# Patient Record
Sex: Female | Born: 1990
Health system: Southern US, Community
[De-identification: ages and names within clinical notes are randomized; demographics above are authoritative.]

## PROBLEM LIST (undated history)

## (undated) DIAGNOSIS — C9590 Leukemia, unspecified not having achieved remission: Secondary | ICD-10-CM

## (undated) DIAGNOSIS — Z331 Pregnant state, incidental: Secondary | ICD-10-CM

## (undated) DIAGNOSIS — B009 Herpesviral infection, unspecified: Secondary | ICD-10-CM

## (undated) HISTORY — PX: OTHER SURGICAL HISTORY: SHX169

## (undated) HISTORY — PX: NO PAST SURGERIES: SHX2092

---

## 2009-11-03 ENCOUNTER — Emergency Department (HOSPITAL_COMMUNITY): Admission: EM | Admit: 2009-11-03 | Discharge: 2009-11-03 | Payer: Self-pay | Admitting: Emergency Medicine

## 2010-06-19 LAB — CBC
Hemoglobin: 12.5 g/dL (ref 12.0–15.0)
MCH: 29.7 pg (ref 26.0–34.0)
RBC: 4.23 MIL/uL (ref 3.87–5.11)

## 2010-06-19 LAB — COMPREHENSIVE METABOLIC PANEL
ALT: 10 U/L (ref 0–35)
AST: 13 U/L (ref 0–37)
CO2: 27 mEq/L (ref 19–32)
Chloride: 105 mEq/L (ref 96–112)
Creatinine, Ser: 0.72 mg/dL (ref 0.4–1.2)
GFR calc Af Amer: >60 mL/min (ref >60)
GFR calc non Af Amer: >60 mL/min (ref >60)
Sodium: 137 mEq/L (ref 135–145)
Total Bilirubin: 0.4 mg/dL (ref 0.3–1.2)

## 2010-06-19 LAB — DIFFERENTIAL
Basophils Absolute: 0 10*3/uL (ref 0.0–0.1)
Basophils Relative: 0 % (ref 0–1)
Eosinophils Absolute: 0.1 10*3/uL (ref 0.0–0.7)
Eosinophils Relative: 1 % (ref 0–5)

## 2010-06-19 LAB — URINE CULTURE

## 2010-06-19 LAB — URINALYSIS, ROUTINE W REFLEX MICROSCOPIC
Glucose, UA: NEGATIVE mg/dL
Ketones, ur: NEGATIVE mg/dL
Urobilinogen, UA: 0.2 mg/dL (ref 0.0–1.0)

## 2010-06-19 LAB — URINE MICROSCOPIC-ADD ON

## 2011-02-02 LAB — OB RESULTS CONSOLE HIV ANTIBODY (ROUTINE TESTING): HIV: NONREACTIVE

## 2011-02-02 LAB — OB RESULTS CONSOLE RPR: RPR: NONREACTIVE

## 2011-02-02 LAB — OB RESULTS CONSOLE GC/CHLAMYDIA: Chlamydia: NEGATIVE

## 2011-02-02 LAB — OB RESULTS CONSOLE ABO/RH: RH Type: POSITIVE

## 2011-02-02 LAB — OB RESULTS CONSOLE RUBELLA ANTIBODY, IGM: Rubella: IMMUNE

## 2011-02-02 LAB — OB RESULTS CONSOLE HEPATITIS B SURFACE ANTIGEN: Hepatitis B Surface Ag: NEGATIVE

## 2011-03-08 ENCOUNTER — Encounter: Payer: Self-pay | Admitting: *Deleted

## 2011-03-08 ENCOUNTER — Emergency Department (HOSPITAL_COMMUNITY)
Admission: EM | Admit: 2011-03-08 | Discharge: 2011-03-09 | Discharge status: Against Medical Advice | Payer: Medicaid Other | Attending: Emergency Medicine | Admitting: Emergency Medicine

## 2011-03-08 DIAGNOSIS — O210 Mild hyperemesis gravidarum: Secondary | ICD-10-CM | POA: Insufficient documentation

## 2011-03-08 DIAGNOSIS — R197 Diarrhea, unspecified: Secondary | ICD-10-CM | POA: Insufficient documentation

## 2011-03-08 HISTORY — DX: Leukemia, unspecified not having achieved remission: C95.90

## 2011-03-08 HISTORY — DX: Pregnant state, incidental: Z33.1

## 2011-03-08 NOTE — ED Notes (Signed)
Vomiting all day,  diarrhea

## 2011-04-06 NOTE — L&D Delivery Note (Signed)
I was present for this delivery.  Agree with above note.  Levie Heritage, DO 10/20/2011 12:04 PM

## 2011-04-06 NOTE — L&D Delivery Note (Signed)
Delivery Note At 10:42 AM a viable and healthy female was delivered via  (Presentation: LOA).  APGAR: , ; weight .   Placenta status: delivered intact, spontaneous.  Cord: 3 vessels.  No complications.  Anesthesia: Epidural  Lacerations: n/a  Est. Blood Loss (mL): 500  Mom to postpartum.  Baby to nursery-stable.  Bedelia Person, PA-S 08/26/2011, 10:57 AM

## 2011-08-25 ENCOUNTER — Encounter (HOSPITAL_COMMUNITY): Payer: Self-pay | Admitting: *Deleted

## 2011-08-25 ENCOUNTER — Inpatient Hospital Stay (HOSPITAL_COMMUNITY)
Admission: AD | Admit: 2011-08-25 | Discharge: 2011-08-26 | Disposition: A | Discharge status: Home / Self Care | Payer: Medicaid Other | Source: Ambulatory Visit | Attending: Obstetrics & Gynecology | Admitting: Obstetrics & Gynecology

## 2011-08-25 DIAGNOSIS — O99891 Other specified diseases and conditions complicating pregnancy: Secondary | ICD-10-CM | POA: Insufficient documentation

## 2011-08-25 DIAGNOSIS — R109 Unspecified abdominal pain: Secondary | ICD-10-CM | POA: Insufficient documentation

## 2011-08-25 HISTORY — DX: Herpesviral infection, unspecified: B00.9

## 2011-08-25 NOTE — MAU Note (Signed)
Pt G1 at 38wks, cramping, passed mucous plug 2 hrs ago.  Seen in the office today SVE 2cm.

## 2011-08-26 ENCOUNTER — Encounter (HOSPITAL_COMMUNITY): Payer: Self-pay | Admitting: Anesthesiology

## 2011-08-26 ENCOUNTER — Inpatient Hospital Stay (HOSPITAL_COMMUNITY): Payer: Medicaid Other | Admitting: Anesthesiology

## 2011-08-26 ENCOUNTER — Inpatient Hospital Stay (HOSPITAL_COMMUNITY)
Admission: AD | Admit: 2011-08-26 | Discharge: 2011-08-27 | DRG: 775 | Disposition: A | Discharge status: Home / Self Care | Payer: Medicaid Other | Source: Ambulatory Visit | Attending: Obstetrics & Gynecology | Admitting: Obstetrics & Gynecology

## 2011-08-26 ENCOUNTER — Encounter (HOSPITAL_COMMUNITY): Payer: Self-pay | Admitting: *Deleted

## 2011-08-26 DIAGNOSIS — IMO0001 Reserved for inherently not codable concepts without codable children: Secondary | ICD-10-CM

## 2011-08-26 LAB — CBC
HCT: 35.3 % — ABNORMAL LOW (ref 36.0–46.0)
Hemoglobin: 11.7 g/dL — ABNORMAL LOW (ref 12.0–15.0)
WBC: 19.1 10*3/uL — ABNORMAL HIGH (ref 4.0–10.5)

## 2011-08-26 MED ORDER — LACTATED RINGERS IV SOLN
INTRAVENOUS | Status: DC
  Administered 2011-08-26: 125 mL/h via INTRAVENOUS
  Administered 2011-08-26: 06:00:00 via INTRAVENOUS

## 2011-08-26 MED ORDER — PRENATAL MULTIVITAMIN CH
1.0000 | ORAL_TABLET | Freq: Every day | ORAL | Status: DC
  Administered 2011-08-27: 1 via ORAL
  Filled 2011-08-26: qty 1

## 2011-08-26 MED ORDER — PHENYLEPHRINE 40 MCG/ML (10ML) SYRINGE FOR IV PUSH (FOR BLOOD PRESSURE SUPPORT)
80.0000 ug | PREFILLED_SYRINGE | INTRAVENOUS | Status: DC | PRN
  Filled 2011-08-26: qty 5

## 2011-08-26 MED ORDER — SIMETHICONE 80 MG PO CHEW
80.0000 mg | CHEWABLE_TABLET | ORAL | Status: DC | PRN
Start: 2011-08-26 — End: 2011-08-27

## 2011-08-26 MED ORDER — PHENYLEPHRINE 40 MCG/ML (10ML) SYRINGE FOR IV PUSH (FOR BLOOD PRESSURE SUPPORT): 80.0000 ug | PREFILLED_SYRINGE | INTRAVENOUS | Status: DC | PRN

## 2011-08-26 MED ORDER — FENTANYL CITRATE 0.05 MG/ML IJ SOLN
50.0000 ug | INTRAMUSCULAR | Status: DC | PRN
  Administered 2011-08-26: 50 ug via INTRAVENOUS
  Filled 2011-08-26: qty 2

## 2011-08-26 MED ORDER — FENTANYL 2.5 MCG/ML BUPIVACAINE 1/10 % EPIDURAL INFUSION (WH - ANES): 14.0000 mL/h | INTRAMUSCULAR | Status: DC

## 2011-08-26 MED ORDER — OXYCODONE-ACETAMINOPHEN 5-325 MG PO TABS: 1.0000 | ORAL_TABLET | ORAL | Status: DC | PRN

## 2011-08-26 MED ORDER — BENZOCAINE-MENTHOL 20-0.5 % EX AERO
1.0000 "application " | INHALATION_SPRAY | CUTANEOUS | Status: DC | PRN
  Administered 2011-08-26: 1 via TOPICAL
  Filled 2011-08-26: qty 56

## 2011-08-26 MED ORDER — IBUPROFEN 600 MG PO TABS: 600.0000 mg | ORAL_TABLET | Freq: Four times a day (QID) | ORAL | Status: DC | PRN

## 2011-08-26 MED ORDER — TETANUS-DIPHTH-ACELL PERTUSSIS 5-2.5-18.5 LF-MCG/0.5 IM SUSP: 0.5000 mL | Freq: Once | INTRAMUSCULAR | Status: DC

## 2011-08-26 MED ORDER — OXYTOCIN 20 UNITS IN LACTATED RINGERS INFUSION - SIMPLE
1.0000 m[IU]/min | INTRAVENOUS | Status: DC
  Administered 2011-08-26: 2 m[IU]/min via INTRAVENOUS
  Filled 2011-08-26: qty 1000

## 2011-08-26 MED ORDER — LACTATED RINGERS IV SOLN: 500.0000 mL | Freq: Once | INTRAVENOUS | Status: DC

## 2011-08-26 MED ORDER — FENTANYL 2.5 MCG/ML BUPIVACAINE 1/10 % EPIDURAL INFUSION (WH - ANES)
INTRAMUSCULAR | Status: DC | PRN
  Administered 2011-08-26: 14 mL/h via EPIDURAL

## 2011-08-26 MED ORDER — FENTANYL 2.5 MCG/ML BUPIVACAINE 1/10 % EPIDURAL INFUSION (WH - ANES)
14.0000 mL/h | INTRAMUSCULAR | Status: DC
  Filled 2011-08-26: qty 60

## 2011-08-26 MED ORDER — EPHEDRINE 5 MG/ML INJ: 10.0000 mg | INTRAVENOUS | Status: DC | PRN

## 2011-08-26 MED ORDER — CITRIC ACID-SODIUM CITRATE 334-500 MG/5ML PO SOLN: 30.0000 mL | ORAL | Status: DC | PRN

## 2011-08-26 MED ORDER — DIPHENHYDRAMINE HCL 25 MG PO CAPS: 25.0000 mg | ORAL_CAPSULE | Freq: Four times a day (QID) | ORAL | Status: DC | PRN

## 2011-08-26 MED ORDER — WITCH HAZEL-GLYCERIN EX PADS: 1.0000 "application " | MEDICATED_PAD | CUTANEOUS | Status: DC | PRN

## 2011-08-26 MED ORDER — PRENATAL MULTIVITAMIN CH: 1.0000 | ORAL_TABLET | Freq: Every day | ORAL | Status: DC

## 2011-08-26 MED ORDER — ACETAMINOPHEN 325 MG PO TABS: 650.0000 mg | ORAL_TABLET | ORAL | Status: DC | PRN

## 2011-08-26 MED ORDER — FLEET ENEMA 7-19 GM/118ML RE ENEM: 1.0000 | ENEMA | RECTAL | Status: DC | PRN

## 2011-08-26 MED ORDER — ZOLPIDEM TARTRATE 5 MG PO TABS: 5.0000 mg | ORAL_TABLET | Freq: Every evening | ORAL | Status: DC | PRN

## 2011-08-26 MED ORDER — LIDOCAINE HCL (PF) 1 % IJ SOLN
30.0000 mL | INTRAMUSCULAR | Status: DC | PRN
  Filled 2011-08-26: qty 30

## 2011-08-26 MED ORDER — EPHEDRINE 5 MG/ML INJ
10.0000 mg | INTRAVENOUS | Status: DC | PRN
  Filled 2011-08-26: qty 4

## 2011-08-26 MED ORDER — ONDANSETRON HCL 4 MG/2ML IJ SOLN: 4.0000 mg | Freq: Four times a day (QID) | INTRAMUSCULAR | Status: DC | PRN

## 2011-08-26 MED ORDER — ONDANSETRON HCL 4 MG/2ML IJ SOLN: 4.0000 mg | INTRAMUSCULAR | Status: DC | PRN

## 2011-08-26 MED ORDER — FAMOTIDINE 20 MG PO TABS: 20.0000 mg | ORAL_TABLET | Freq: Two times a day (BID) | ORAL | Status: DC

## 2011-08-26 MED ORDER — MISOPROSTOL 200 MCG PO TABS
ORAL_TABLET | ORAL | Status: AC
  Filled 2011-08-26: qty 4

## 2011-08-26 MED ORDER — OXYTOCIN 20 UNITS IN LACTATED RINGERS INFUSION - SIMPLE
125.0000 mL/h | Freq: Once | INTRAVENOUS | Status: AC
  Administered 2011-08-26: 125 mL/h via INTRAVENOUS

## 2011-08-26 MED ORDER — LACTATED RINGERS IV SOLN: INTRAVENOUS | Status: DC

## 2011-08-26 MED ORDER — IBUPROFEN 600 MG PO TABS
600.0000 mg | ORAL_TABLET | Freq: Four times a day (QID) | ORAL | Status: DC
  Administered 2011-08-26 – 2011-08-27 (×5): 600 mg via ORAL
  Filled 2011-08-26 (×5): qty 1

## 2011-08-26 MED ORDER — LIDOCAINE HCL (PF) 1 % IJ SOLN
INTRAMUSCULAR | Status: DC | PRN
  Administered 2011-08-26 (×2): 8 mL

## 2011-08-26 MED ORDER — ONDANSETRON HCL 4 MG PO TABS: 4.0000 mg | ORAL_TABLET | ORAL | Status: DC | PRN

## 2011-08-26 MED ORDER — DIPHENHYDRAMINE HCL 50 MG/ML IJ SOLN: 12.5000 mg | INTRAMUSCULAR | Status: DC | PRN

## 2011-08-26 MED ORDER — OXYTOCIN BOLUS FROM INFUSION
500.0000 mL | Freq: Once | INTRAVENOUS | Status: DC
  Filled 2011-08-26: qty 500

## 2011-08-26 MED ORDER — LACTATED RINGERS IV SOLN: 500.0000 mL | INTRAVENOUS | Status: DC | PRN

## 2011-08-26 MED ORDER — LIDOCAINE HCL (PF) 1 % IJ SOLN: 30.0000 mL | INTRAMUSCULAR | Status: DC | PRN

## 2011-08-26 MED ORDER — LANOLIN HYDROUS EX OINT: TOPICAL_OINTMENT | CUTANEOUS | Status: DC | PRN

## 2011-08-26 MED ORDER — HYDROXYZINE HCL 50 MG/ML IM SOLN
50.0000 mg | Freq: Four times a day (QID) | INTRAMUSCULAR | Status: DC | PRN
  Filled 2011-08-26: qty 1

## 2011-08-26 MED ORDER — HYDROXYZINE HCL 50 MG PO TABS: 50.0000 mg | ORAL_TABLET | Freq: Four times a day (QID) | ORAL | Status: DC | PRN

## 2011-08-26 MED ORDER — TERBUTALINE SULFATE 1 MG/ML IJ SOLN: 0.2500 mg | Freq: Once | INTRAMUSCULAR | Status: DC | PRN

## 2011-08-26 MED ORDER — DIBUCAINE 1 % RE OINT: 1.0000 "application " | TOPICAL_OINTMENT | RECTAL | Status: DC | PRN

## 2011-08-26 MED ORDER — NALBUPHINE SYRINGE 5 MG/0.5 ML: 5.0000 mg | INJECTION | INTRAMUSCULAR | Status: DC | PRN

## 2011-08-26 MED ORDER — OXYCODONE-ACETAMINOPHEN 5-325 MG PO TABS
1.0000 | ORAL_TABLET | ORAL | Status: DC | PRN
  Administered 2011-08-27: 1 via ORAL
  Filled 2011-08-26: qty 1

## 2011-08-26 MED ORDER — OXYTOCIN 20 UNITS IN LACTATED RINGERS INFUSION - SIMPLE: 125.0000 mL/h | Freq: Once | INTRAVENOUS | Status: DC

## 2011-08-26 MED ORDER — ACYCLOVIR 400 MG PO TABS
400.0000 mg | ORAL_TABLET | Freq: Two times a day (BID) | ORAL | Status: DC
  Filled 2011-08-26: qty 1

## 2011-08-26 MED ORDER — SENNOSIDES-DOCUSATE SODIUM 8.6-50 MG PO TABS
2.0000 | ORAL_TABLET | Freq: Every day | ORAL | Status: DC
  Administered 2011-08-26: 2 via ORAL

## 2011-08-26 NOTE — Discharge Instructions (Signed)
Normal Labor and Delivery Your caregiver must first be sure you are in labor. Signs of labor include:  You may pass what is called "the mucus plug" before labor begins. This is a small amount of blood stained mucus.   Regular uterine contractions.   The time between contractions get closer together.   The discomfort and pain gradually gets more intense.   Pains are mostly located in the back.   Pains get worse when walking.   The cervix (the opening of the uterus becomes thinner (begins to efface) and opens up (dilates).  Once you are in labor and admitted into the hospital or care center, your caregiver will do the following:  A complete physical examination.   Check your vital signs (blood pressure, pulse, temperature and the fetal heart rate).   Do a vaginal examination (using a sterile glove and lubricant) to determine:   The position (presentation) of the baby (head [vertex] or buttock first).   The level (station) of the baby's head in the birth canal.   The effacement and dilatation of the cervix.   You may have your pubic hair shaved and be given an enema depending on your caregiver and the circumstance.   An electronic monitor is usually placed on your abdomen. The monitor follows the length and intensity of the contractions, as well as the baby's heart rate.   Usually, your caregiver will insert an IV in your arm with a bottle of sugar water. This is done as a precaution so that medications can be given to you quickly during labor or delivery.  NORMAL LABOR AND DELIVERY IS DIVIDED UP INTO 3 STAGES: First Stage This is when regular contractions begin and the cervix begins to efface and dilate. This stage can last from 3 to 15 hours. The end of the first stage is when the cervix is 100% effaced and 10 centimeters dilated. Pain medications may be given by   Injection (morphine, demerol, etc.)   Regional anesthesia (spinal, caudal or epidural, anesthetics given in  different locations of the spine). Paracervical pain medication may be given, which is an injection of and anesthetic on each side of the cervix.  A pregnant woman may request to have "Natural Childbirth" which is not to have any medications or anesthesia during her labor and delivery. Second Stage This is when the baby comes down through the birth canal (vagina) and is born. This can take 1 to 4 hours. As the baby's head comes down through the birth canal, you may feel like you are going to have a bowel movement. You will get the urge to bear down and push until the baby is delivered. As the baby's head is being delivered, the caregiver will decide if an episiotomy (a cut in the perineum and vagina area) is needed to prevent tearing of the tissue in this area. The episiotomy is sewn up after the delivery of the baby and placenta. Sometimes a mask with nitrous oxide is given for the mother to breath during the delivery of the baby to help if there is too much pain. The end of Stage 2 is when the baby is fully delivered. Then when the umbilical cord stops pulsating it is clamped and cut. Third Stage The third stage begins after the baby is completely delivered and ends after the placenta (afterbirth) is delivered. This usually takes 5 to 30 minutes. After the placenta is delivered, a medication is given either by intravenous or injection to help contract   the uterus and prevent bleeding. The third stage is not painful and pain medication is usually not necessary. If an episiotomy was done, it is repaired at this time. After the delivery, the mother is watched and monitored closely for 1 to 2 hours to make sure there is no postpartum bleeding (hemorrhage). If there is a lot of bleeding, medication is given to contract the uterus and stop the bleeding. Document Released: 12/30/2007 Document Revised: 03/11/2011 Document Reviewed: 12/30/2007 Exit[employer] Patient Information 2012 [employer]. 

## 2011-08-26 NOTE — H&P (Signed)
  SAACHI ZALE is a 21 y.o. female G1P0 with IUP at [redacted]w[redacted]d presenting for contractions. Pt states she has been having regular, every 2 minutes contractions, associated with spotting vaginal bleeding, membranes are intact, with active fetal movement.   [facility] at [facility] since 8 wks  Prenatal History/Complications: none Past Medical History: Past Medical History  Diagnosis Date  . Pregnancy as incidental finding   . Leukemia   . HSV-2 (herpes simplex virus 2) infection     Past Surgical History: Past Surgical History  Procedure Date  . No past surgeries     Obstetrical History: OB History    Grav Para Term Preterm Abortions TAB SAB Ect Mult Living   1               Gynecological History: OB History    Grav Para Term Preterm Abortions TAB SAB Ect Mult Living   1               Social History: History   Social History  . Marital Status: Single    Spouse Name: N/A    Number of Children: N/A  . Years of Education: N/A   Social History Main Topics  . Smoking status: Never Smoker   . Smokeless tobacco: None  . Alcohol Use: No  . Drug Use: No     Patient YDS positive for marijuana  . Sexually Active: Yes   Other Topics Concern  . None   Social History Narrative  . None    Family History: No family history on file.  Allergies: Allergies  Allergen Reactions  . Amphotericin B Other (See Comments)    seizures    Prescriptions prior to admission  Medication Sig Dispense Refill  . acetaminophen (TYLENOL) 500 MG tablet Take 1,000 mg by mouth 2 (two) times daily as needed. For pain       . acyclovir (ZOVIRAX) 400 MG tablet Take 400 mg by mouth 2 (two) times daily.      . famotidine (PEPCID) 20 MG tablet Take 20 mg by mouth 2 (two) times daily. As needed for acid reflux      . Prenatal Vit-Fe Fumarate-FA (PRENATAL MULTIVITAMIN) TABS Take 1 tablet by mouth daily.        Review of Systems - Negative except contractions   There were no vitals taken for  this visit. General appearance: alert, cooperative and mild distress Lungs: clear to auscultation bilaterally Heart: regular rate and rhythm Abdomen: soft, non-tender; bowel sounds normal Extremities: Homans sign is negative, no sign of DVT DTR's 2+ Presentation: cephalic Fetal monitoringBaseline: 140 bpm, Variability: Good {> 6 bpm), Accelerations: Reactive and Decelerations: Absent Uterine activity moderate contractions q 2 minutes Dilation: 6.5 Effacement (%): 100 Exam by:: FRAN, CNM   Prenatal labs: ABO, Rh:  O+ Antibody:  neg Rubella:  immunet RPR:   neg HBsAg:   neg HIV:   neg GBS:   neg 2 hr Glucola  78/74/82 Genetic screening  normal Anatomy US normal  .  Assessment: CAYLAN CHENARD is a 21 y.o. G1P0 with an IUP at [redacted]w[redacted]d presenting for active labor  Plan: Admit, epidural   CRESENZO-DISHMAN,Kayne Yuhas 08/26/2011, 5:59 AM

## 2011-08-26 NOTE — Progress Notes (Signed)
Subjective: Patient seen - comfortable with epidural  Objective: BP 130/65  Pulse 95  Temp(Src) 98 F (36.7 C) (Oral)  Resp 18  Ht 5' 5.5" (1.664 m)  Wt 98.884 kg (218 lb)  BMI 35.73 kg/m2      FHT:  FHR: 130 bpm, variability: moderate,  accelerations:  Abscent,  decelerations:  Absent UC:   regular, every 3-5 minutes SVE:   Dilation: 5 Effacement (%): 80 Station: -1 Exam by:: Aaiden Depoy, DO  Labs: Lab Results  Component Value Date   WBC 19.1* 08/26/2011   HGB 11.7* 08/26/2011   HCT 35.3* 08/26/2011   MCV 84.2 08/26/2011   PLT 288 08/26/2011    Assessment / Plan: AROM with clear fluid.  IUPC placed.  Will add pitocin.  Category 1 tracing.  Expect vaginal delivery.  Deanna Wiater JEHIEL 08/26/2011, 8:55 AM

## 2011-08-26 NOTE — MAU Note (Signed)
Monitors removed so patient can ambulate in the hall x1 hr

## 2011-08-26 NOTE — Anesthesia Preprocedure Evaluation (Signed)
Anesthesia Evaluation  Patient identified by MRN, date of birth, ID band Patient awake    Reviewed: Allergy & Precautions, H&P , NPO status , Patient's Chart, lab work & pertinent test results  Airway Mallampati: II TM Distance: >3 FB Neck ROM: full    Dental No notable dental hx.    Pulmonary neg pulmonary ROS,  breath sounds clear to auscultation  Pulmonary exam normal       Cardiovascular negative cardio ROS      Neuro/Psych negative neurological ROS  negative psych ROS   GI/Hepatic negative GI ROS, Neg liver ROS,   Endo/Other  negative endocrine ROSMorbid obesity  Renal/GU negative Renal ROS  negative genitourinary   Musculoskeletal negative musculoskeletal ROS (+)   Abdominal (+) + obese,   Peds negative pediatric ROS (+)  Hematology negative hematology ROS (+)   Anesthesia Other Findings   Reproductive/Obstetrics (+) Pregnancy                           Anesthesia Physical Anesthesia Plan  ASA: III  Anesthesia Plan: Epidural   Post-op Pain Management:    Induction:   Airway Management Planned:   Additional Equipment:   Intra-op Plan:   Post-operative Plan:   Informed Consent: I have reviewed the patients History and Physical, chart, labs and discussed the procedure including the risks, benefits and alternatives for the proposed anesthesia with the patient or authorized representative who has indicated his/her understanding and acceptance.     Plan Discussed with:   Anesthesia Plan Comments:         Anesthesia Quick Evaluation

## 2011-08-26 NOTE — Anesthesia Procedure Notes (Signed)
Epidural Patient location during procedure: OB Start time: 08/26/2011 7:19 AM End time: 08/26/2011 7:26 AM Reason for block: procedure for pain  Staffing Anesthesiologist: Sandrea Hughs Performed by: anesthesiologist   Preanesthetic Checklist Completed: patient identified, site marked, surgical consent, pre-op evaluation, timeout performed, IV checked, risks and benefits discussed and monitors and equipment checked  Epidural Patient position: sitting Prep: site prepped and draped and DuraPrep Patient monitoring: continuous pulse ox and blood pressure Approach: midline Injection technique: LOR air  Needle:  Needle type: Tuohy  Needle gauge: 17 G Needle length: 9 cm Needle insertion depth: 7 cm Catheter type: closed end flexible Catheter size: 19 Gauge Catheter at skin depth: 12 cm Test dose: negative  Assessment Sensory level: T10 Events: blood not aspirated, injection not painful, no injection resistance, negative IV test and no paresthesia

## 2011-08-26 NOTE — Progress Notes (Signed)
UR chart review completed.  

## 2011-08-26 NOTE — Anesthesia Postprocedure Evaluation (Signed)
  Anesthesia Post-op Note  Patient: Jennifer Krause  Procedure(s) Performed: * No procedures listed *  Patient Location: Mother/Baby  Anesthesia Type: Epidural  Level of Consciousness: awake, alert , oriented and sedated  Airway and Oxygen Therapy: Patient Spontanous Breathing  Post-op Pain: none  Post-op Assessment: Patient's Cardiovascular Status Stable, Respiratory Function Stable, Patent Airway, No signs of Nausea or vomiting, Adequate PO intake, Pain level controlled, No headache, No backache, No residual numbness and No residual motor weakness  Post-op Vital Signs: Reviewed and stable  Complications: No apparent anesthesia complications

## 2011-08-27 LAB — CBC
Platelets: 229 10*3/uL (ref 150–400)
RBC: 3.49 MIL/uL — ABNORMAL LOW (ref 3.87–5.11)
WBC: 13.6 10*3/uL — ABNORMAL HIGH (ref 4.0–10.5)

## 2011-08-27 MED ORDER — IBUPROFEN 600 MG PO TABS: 600.0000 mg | ORAL_TABLET | Freq: Four times a day (QID) | ORAL | Status: AC | PRN

## 2011-08-27 NOTE — Progress Notes (Signed)
Post Partum Day 1  Subjective: no complaints, up ad lib, voiding, tolerating PO and + flatus  Objective: Blood pressure 102/59, pulse 69, temperature 98.1 F (36.7 C), temperature source Oral, resp. rate 18, height 5' 5.5" (1.664 m), weight 98.884 kg (218 lb), SpO2 99.00%, unknown if currently breastfeeding.  Physical Exam:  General: alert, cooperative and mild distress Lochia: appropriate Uterine Fundus: firm Incision: n/a DVT Evaluation: No evidence of DVT seen on physical exam. Negative Homan's sign.   Basename 08/27/11 0520 08/26/11 0643  HGB 9.8* 11.7*  HCT 29.7* 35.3*    Assessment/Plan: Discharge home and Contraception thinking aout IUD or OCPs   LOS: 1 day   Milianna Ericsson H. 08/27/2011, 8:06 AM

## 2011-08-27 NOTE — Clinical Social Work Maternal (Addendum)
    Clinical Social Work Department PSYCHOSOCIAL ASSESSMENT - MATERNAL/CHILD 08/27/2011  Patient:  Jennifer Krause, Jennifer Krause  Account Number:  [id number]  Admit Date:  08/26/2011  Marjo Bicker Name:   Emilio Math    Clinical Social Worker:  Andy Gauss   Date/Time:  08/27/2011 10:46 AM  Date Referred:  08/27/2011   Referral source  CN     Referred reason  Substance Abuse   Other referral source:    I:  FAMILY / HOME ENVIRONMENT Child's legal guardian:  PARENT  Guardian - Name Guardian - Age Guardian - Address  Wana Mount 20 [address]; [place], [place] [address]  Aldean Ast 22    Other household support members/support persons Name Relationship DOB   GRAND MOTHER    Other support:    II  PSYCHOSOCIAL DATA Information Source:  Patient Interview  Event organiser Employment:   Financial resources:  [employer] If Medicaid - County:  [employer] Other  [facility]  Food Stamps   School / Grade:   Maternity Care Coordinator / Child Services Coordination / Early Interventions:  Cultural issues impacting care:    III  STRENGTHS Strengths  Adequate Resources  Home prepared for Child (including basic supplies)  Supportive family/friends   Strength comment:    IV  RISK FACTORS AND CURRENT PROBLEMS Current Problem:  YES   Risk Factor & Current Problem Patient Issue Family Issue Risk Factor / Current Problem Comment  Substance Abuse Y N Hx of MJ    V  SOCIAL WORK ASSESSMENT Pt admits to smoking MJ, daily prior to pregnancy confirmation at 8 weeks.  Once pregnancy was confirmed, she reports that she stopped smoking until 2 weeks ago.  Pt explained that she was stressed out and "hit it a couple times."  Pt did not talk about what caused her to feel stressed but told Sw that the issues are resolved.  As per chart review, it appears pt was positive for MJ, 3 times during the pregnancy, noting last use 08/19/11.  Sw explained hospital drug testing policy and pt  verbalized understanding.  UDS is negative, meconium results are pending.  Pt only admits to smoking one time during the pregnancy but reports being around it, since FOB smokes.  She reports having all the necessary supplies for the infant and good support.  Pt identified her mother, as per primary support person.  FOB is involved, as per pt.  Sw will follow up with drug screen results and make a referral if needed.      VI SOCIAL WORK PLAN Social Work Plan  No Further Intervention Required / No Barriers to Discharge   Type of pt/family education:   If child protective services report - county:   If child protective services report - date:   Information/referral to community resources comment:   Other social work plan:

## 2011-08-27 NOTE — Discharge Summary (Signed)
Obstetric Discharge Summary Reason for Admission: onset of labor Prenatal Procedures: NST Intrapartum Procedures: spontaneous vaginal delivery Postpartum Procedures: none Complications-Operative and Postpartum: none Hemoglobin  Date Value Range Status  08/27/2011 9.8* 12.0-15.0 (g/dL) Final     HCT  Date Value Range Status  08/27/2011 29.7* 36.0-46.0 (%) Final    Physical Exam:  General: alert, cooperative and no distress Lochia: appropriate Uterine Fundus: firm Incision: n/a DVT Evaluation: No evidence of DVT seen on physical exam.  Discharge Diagnoses: Term Pregnancy-delivered  Discharge Information: Date: 08/27/2011 Activity: pelvic rest Diet: routine Medications: PNV and Ibuprofen Condition: improved Instructions: refer to practice specific booklet Discharge to: home Follow-up Information    Follow up with [facility]. Schedule an appointment as soon as possible for a visit in 5 weeks.   Contact information:   [address] [place] [address]          Newborn Data: Live born female  Birth Weight: 6 lb 0.7 oz (2740 g) APGAR: 8, 9  Home with mother.  Jennifer Hank H. 08/27/2011, 8:09 AM

## 2011-08-27 NOTE — Discharge Instructions (Signed)
Contraception Choices Birth control (contraception) can stop pregnancy from happening. Different types of birth control work in different ways. Some can:  Make the mucus in the cervix thick. This makes it hard for sperm to get into the uterus.   Thin the lining of the uterus. This makes it hard for an egg to attach to the wall of the uterus.   Stop the ovaries from releasing an egg.   Block the sperm from reaching the egg.  Certain types of surgery can stop pregnancy from happening. For women, the sugery closes the fallopian tubes (tubal ligation). For men, the surgery stops sperm from releasing during sex (vasectomy). HORMONAL BIRTH CONTROL Hormonal birth control stops pregnancy by putting hormones into your body. Types of birth control include:  A small tube put under the skin of the upper arm (implant). The tube can stay in place for 3 years.   Shots given every 3 months.   Pills taken every day or once after sex (intercourse).   Patches that are changed once a week.   A ring put into the vagina (vaginal ring). The ring is left in place for 3 weeks and removed for 1 week. Then, a new ring is put in the vagina.  BARRIER BIRTH CONTROL  Barrier birth control blocks sperm from reaching the egg. Types of birth control include:   A thin covering worn on the penis (female condom) during sex.   A soft, loose covering put into the vagina (female condom) before sex.   A rubber bowl that sits over the cervix (diaphragm). The bowl must be made for you. The bowl is put into the vagina before sex. The bowl is left in place for 6 to 8 hours after sex.   A small, soft cup that fits over the cervix (cervical cap). The cup must be made for you. The cup can be left in place for 48 hours after sex.   A sponge that is put into the vagina before sex.   A chemical that kills or blocks sperm from getting into the cervix and uterus (spermicide). The chemical may be a cream, jelly, foam, or pill.    INTRAUTERINE (IUD) BIRTH CONTROL  IUD birth control is a small, T-shaped piece of plastic. The plastic is put inside the uterus. There are 2 types of IUD:  Copper IUD. The IUD is covered in copper wire. The copper makes a fluid that kills sperm. It can stay in place for 10 years.   Hormone IUD. The hormone stops pregnancy from happening. It can stay in place for 5 years.  NATURAL FAMILY PLANNING BIRTH CONTROL  Natural family planning means not having sex or using barrier birth control when the woman is fertile. A woman can:  Use a calendar to keep track of when she is fertile.   Use a thermometer to measure her body temperature.  Protect yourself against sexual diseases no matter what type of birth control you use. Talk to your doctor about which type of birth control is best for you. Document Released: 01/17/2009 Document Revised: 03/11/2011 Document Reviewed: 07/29/2010 [facility] Patient Information 2012 [place], [place].Levonorgestrel intrauterine device (IUD) What is this medicine? LEVONORGESTREL IUD (LEE voe nor jes trel) is a contraceptive (birth control) device. It is used to prevent pregnancy and to treat heavy bleeding that occurs during your period. It can be used for up to 5 years. This medicine may be used for other purposes; ask your health care provider or pharmacist if you  have questions. What should I tell my health care provider before I take this medicine? They need to know if you have any of these conditions: -abnormal Pap smear -cancer of the breast, uterus, or cervix -diabetes -endometritis -genital or pelvic infection now or in the past -have more than one sexual partner or your partner has more than one partner -heart disease -history of an ectopic or tubal pregnancy -immune system problems -IUD in place -liver disease or tumor -problems with blood clots or take blood-thinners -use intravenous drugs -uterus of unusual shape -vaginal bleeding that has not been  explained -an unusual or allergic reaction to levonorgestrel, other hormones, silicone, or polyethylene, medicines, foods, dyes, or preservatives -pregnant or trying to get pregnant -breast-feeding How should I use this medicine? This device is placed inside the uterus by a health care professional. Talk to your pediatrician regarding the use of this medicine in children. Special care may be needed. Overdosage: If you think you have taken too much of this medicine contact a poison control center or emergency room at once. NOTE: This medicine is only for you. Do not share this medicine with others. What if I miss a dose? This does not apply. What may interact with this medicine? Do not take this medicine with any of the following medications: -amprenavir -bosentan -fosamprenavir This medicine may also interact with the following medications: -aprepitant -barbiturate medicines for inducing sleep or treating seizures -bexarotene -griseofulvin -medicines to treat seizures like carbamazepine, ethotoin, felbamate, oxcarbazepine, phenytoin, topiramate -modafinil -pioglitazone -rifabutin -rifampin -rifapentine -some medicines to treat HIV infection like atazanavir, indinavir, lopinavir, nelfinavir, tipranavir, ritonavir -St. John's wort -warfarin This list may not describe all possible interactions. Give your health care provider a list of all the medicines, herbs, non-prescription drugs, or dietary supplements you use. Also tell them if you smoke, drink alcohol, or use illegal drugs. Some items may interact with your medicine. What should I watch for while using this medicine? Visit your doctor or health care professional for regular check ups. See your doctor if you or your partner has sexual contact with others, becomes HIV positive, or gets a sexual transmitted disease. This product does not protect you against HIV infection (AIDS) or other sexually transmitted diseases. You can check  the placement of the IUD yourself by reaching up to the top of your vagina with clean fingers to feel the threads. Do not pull on the threads. It is a good habit to check placement after each menstrual period. Call your doctor right away if you feel more of the IUD than just the threads or if you cannot feel the threads at all. The IUD may come out by itself. You may become pregnant if the device comes out. If you notice that the IUD has come out use a backup birth control method like condoms and call your health care provider. Using tampons will not change the position of the IUD and are okay to use during your period. What side effects may I notice from receiving this medicine? Side effects that you should report to your doctor or health care professional as soon as possible: -allergic reactions like skin rash, itching or hives, swelling of the face, lips, or tongue -fever, flu-like symptoms -genital sores -high blood pressure -no menstrual period for 6 weeks during use -pain, swelling, warmth in the leg -pelvic pain or tenderness -severe or sudden headache -signs of pregnancy -stomach cramping -sudden shortness of breath -trouble with balance, talking, or walking -unusual vaginal bleeding, discharge -  yellowing of the eyes or skin Side effects that usually do not require medical attention (report to your doctor or health care professional if they continue or are bothersome): -acne -breast pain -change in sex drive or performance -changes in weight -cramping, dizziness, or faintness while the device is being inserted -headache -irregular menstrual bleeding within first 3 to 6 months of use -nausea This list may not describe all possible side effects. Call your doctor for medical advice about side effects. You may report side effects to FDA at 1-800-FDA-1088. Where should I keep my medicine? This does not apply. NOTE: This sheet is a summary. It may not cover all possible information.  If you have questions about this medicine, talk to your doctor, pharmacist, or health care provider.  2012, [employer]vier/Gold Standard. (04/12/2008 6:39:08 PM)

## 2012-03-27 ENCOUNTER — Other Ambulatory Visit: Payer: Self-pay | Admitting: Adult Health

## 2012-03-27 ENCOUNTER — Other Ambulatory Visit (HOSPITAL_COMMUNITY)
Admission: RE | Admit: 2012-03-27 | Discharge: 2012-03-27 | Disposition: A | Discharge status: Home / Self Care | Payer: Medicaid Other | Source: Ambulatory Visit | Attending: Obstetrics and Gynecology | Admitting: Obstetrics and Gynecology

## 2012-03-27 DIAGNOSIS — Z113 Encounter for screening for infections with a predominantly sexual mode of transmission: Secondary | ICD-10-CM | POA: Insufficient documentation

## 2012-03-27 DIAGNOSIS — Z01419 Encounter for gynecological examination (general) (routine) without abnormal findings: Secondary | ICD-10-CM | POA: Insufficient documentation

## 2012-06-26 ENCOUNTER — Encounter: Payer: Self-pay | Admitting: *Deleted

## 2012-06-27 ENCOUNTER — Encounter: Payer: Self-pay | Admitting: Advanced Practice Midwife

## 2012-07-11 ENCOUNTER — Encounter: Payer: Self-pay | Admitting: Advanced Practice Midwife

## 2012-07-11 ENCOUNTER — Telehealth: Payer: Self-pay | Admitting: Obstetrics & Gynecology

## 2012-07-11 ENCOUNTER — Ambulatory Visit (INDEPENDENT_AMBULATORY_CARE_PROVIDER_SITE_OTHER): Payer: Medicaid Other | Admitting: Advanced Practice Midwife

## 2012-07-11 VITALS — BP 128/60 | Wt 211.2 lb

## 2012-07-11 DIAGNOSIS — Z3046 Encounter for surveillance of implantable subdermal contraceptive: Secondary | ICD-10-CM

## 2012-07-11 DIAGNOSIS — B009 Herpesviral infection, unspecified: Secondary | ICD-10-CM | POA: Insufficient documentation

## 2012-07-11 DIAGNOSIS — IMO0001 Reserved for inherently not codable concepts without codable children: Secondary | ICD-10-CM

## 2012-07-11 LAB — POCT URINE PREGNANCY: Preg Test, Ur: NEGATIVE

## 2012-07-11 MED ORDER — NORETHIN-ETH ESTRAD-FE BIPHAS 1 MG-10 MCG / 10 MCG PO TABS: 1.0000 | ORAL_TABLET | Freq: Every day | ORAL | Status: DC

## 2012-07-11 NOTE — Progress Notes (Signed)
Jennifer Krause 21 y.o. Has had Nexplanon for 8 months with nearly constant bleeding and ager issues.  Wants it out and wants to start pills.  Patient given informed consent for removal of her Implanon, time out was performed.  Appropriate time out taken. Implanon site identified.  Area prepped in usual sterile fashon. One cc of 1% lidocaine was used to anesthetize the area at the distal end of the implant. A small stab incision was made right beside the implant on the distal portion.  The implanon rod was grasped using hemostats and removed without difficulty.  There was less than 3 cc blood loss. There were no complications. Steri-strips were applied over the small incision.  A pressure bandage was applied to reduce any bruising.  The patient tolerated the procedure well and was given post procedure instructions.  She knows to use condoms for the next 3 weeks if she is sexually active

## 2013-04-23 ENCOUNTER — Emergency Department (HOSPITAL_COMMUNITY)
Admission: EM | Admit: 2013-04-23 | Discharge: 2013-04-23 | Discharge status: Against Medical Advice | Payer: Medicaid Other

## 2014-02-04 ENCOUNTER — Encounter: Payer: Self-pay | Admitting: Advanced Practice Midwife

## 2014-10-23 ENCOUNTER — Encounter (HOSPITAL_COMMUNITY): Payer: Self-pay | Admitting: Emergency Medicine

## 2014-10-23 ENCOUNTER — Emergency Department (HOSPITAL_COMMUNITY)
Admission: EM | Admit: 2014-10-23 | Discharge: 2014-10-23 | Disposition: A | Discharge status: Home / Self Care | Payer: Medicaid Other | Attending: Emergency Medicine | Admitting: Emergency Medicine

## 2014-10-23 ENCOUNTER — Emergency Department (HOSPITAL_COMMUNITY): Payer: Medicaid Other

## 2014-10-23 DIAGNOSIS — Z793 Long term (current) use of hormonal contraceptives: Secondary | ICD-10-CM | POA: Insufficient documentation

## 2014-10-23 DIAGNOSIS — Y9301 Activity, walking, marching and hiking: Secondary | ICD-10-CM | POA: Insufficient documentation

## 2014-10-23 DIAGNOSIS — Z8619 Personal history of other infectious and parasitic diseases: Secondary | ICD-10-CM | POA: Insufficient documentation

## 2014-10-23 DIAGNOSIS — X58XXXA Exposure to other specified factors, initial encounter: Secondary | ICD-10-CM | POA: Insufficient documentation

## 2014-10-23 DIAGNOSIS — Z856 Personal history of leukemia: Secondary | ICD-10-CM | POA: Insufficient documentation

## 2014-10-23 DIAGNOSIS — Y998 Other external cause status: Secondary | ICD-10-CM | POA: Insufficient documentation

## 2014-10-23 DIAGNOSIS — Z79899 Other long term (current) drug therapy: Secondary | ICD-10-CM | POA: Insufficient documentation

## 2014-10-23 DIAGNOSIS — Y9289 Other specified places as the place of occurrence of the external cause: Secondary | ICD-10-CM | POA: Insufficient documentation

## 2014-10-23 DIAGNOSIS — S93401A Sprain of unspecified ligament of right ankle, initial encounter: Secondary | ICD-10-CM

## 2014-10-23 DIAGNOSIS — Z720 Tobacco use: Secondary | ICD-10-CM | POA: Insufficient documentation

## 2014-10-23 MED ORDER — OXYCODONE-ACETAMINOPHEN 5-325 MG PO TABS
1.0000 | ORAL_TABLET | Freq: Once | ORAL | Status: AC
  Administered 2014-10-23: 1 via ORAL
  Filled 2014-10-23: qty 1

## 2014-10-23 MED ORDER — OXYCODONE-ACETAMINOPHEN 5-325 MG PO TABS: 1.0000 | ORAL_TABLET | ORAL | Status: DC | PRN

## 2014-10-23 NOTE — ED Provider Notes (Signed)
CSN: [id number]     Arrival date & time 10/23/14  0047 History   First MD Initiated Contact with Patient 10/23/14 0057     Chief Complaint  Patient presents with  . Ankle Pain     (Consider location/radiation/quality/duration/timing/severity/associated sxs/prior Treatment) Patient is a 24 y.o. female presenting with ankle pain. The history is provided by the patient.  Ankle Pain She states that 3 days ago, she woke up and walked to turn on the air conditioner. Her feet for sleep. She felt a pop in her right ankle and has had pain and swelling in the ankle since then. She's not sure if she twisted the ankle. She did apply ice and has been using soaks in Epsom salts as well as taking ibuprofen. Swelling in the ankle has: Down but it is still swollen. She rates pain at 6/10. Pain is worse with palpation and ambulating.  Past Medical History  Diagnosis Date  . Pregnancy as incidental finding   . Leukemia   . HSV-2 (herpes simplex virus 2) infection    Past Surgical History  Procedure Laterality Date  . No past surgeries    . Leukemia     Family History  Problem Relation Age of Onset  . Hypertension Mother   . Diabetes Other    History  Substance Use Topics  . Smoking status: Current Some Day Smoker    Types: Cigarettes  . Smokeless tobacco: Never Used  . Alcohol Use: Yes     Comment: has a beer every now and then.   OB History    Gravida Para Term Preterm AB TAB SAB Ectopic Multiple Living   1 1 1  0 0 0 0 0 0 1     Review of Systems  All other systems reviewed and are negative.     Allergies  Amphotericin b  Home Medications   Prior to Admission medications   Medication Sig Start Date End Date Taking? Authorizing Provider  famotidine (PEPCID) 20 MG tablet Take 20 mg by mouth 2 (two) times daily. As needed for acid reflux    Historical Provider, MD  Norethindrone-Ethinyl Estradiol-Fe Biphas (LO LOESTRIN FE) 1 MG-10 MCG / 10 MCG tablet Take 1 tablet by mouth daily.  07/11/12   Florian Buff, MD  Prenatal Vit-Fe Fumarate-FA (PRENATAL MULTIVITAMIN) TABS Take 1 tablet by mouth daily.    Historical Provider, MD   BP 124/71 mmHg  Pulse 86  Temp(Src) 97.8 F (36.6 C) (Oral)  Resp 20  Ht 5' 5.5" (1.664 m)  SpO2 99%  LMP 10/09/2014 Physical Exam  Nursing note and vitals reviewed.  24 year old female, resting comfortably and in no acute distress. Vital signs are normal. Oxygen saturation is 99%, which is normal. Head is normocephalic and atraumatic. PERRLA, EOMI. Oropharynx is clear. Neck is nontender and supple without adenopathy or JVD. Back is nontender and there is no CVA tenderness. Lungs are clear without rales, wheezes, or rhonchi. Chest is nontender. Heart has regular rate and rhythm without murmur. Abdomen is soft, flat, nontender without masses or hepatosplenomegaly and peristalsis is normoactive. Extremities: Moderate swelling on the lateral aspect of the right ankle and extending into the midfoot. There is moderate tenderness to palpation over the base of the right fifth metatarsal, and also over the anterior fibulotalar ligament. There is no instability of the ankle mortise and anterior drawer sign is negative. Distal neurovascular exam is intact with normal sensation and prompt capillary refill. Skin is warm and dry without  rash. Neurologic: Mental status is normal, cranial nerves are intact, there are no motor or sensory deficits.  ED Course  Procedures (including critical care time)  Imaging Review Dg Ankle Complete Right  10/23/2014   CLINICAL DATA:  Initial evaluation for acute pain.  EXAM: RIGHT ANKLE - COMPLETE 3+ VIEW  COMPARISON:  None.  FINDINGS: No acute fracture dislocation. Ankle mortise approximated. Talar dome intact. Focal soft tissue swelling present at the lateral malleolus. Osseous mineralization normal.  IMPRESSION: 1. No acute fracture or dislocation. 2. Focal soft tissue swelling at the lateral malleolus.   Electronically  Signed   By: Jeannine Boga M.D.   On: 10/23/2014 02:26   Dg Foot Complete Right  10/23/2014   CLINICAL DATA:  Injury to right ankle, with pain and swelling at the lateral right ankle. Initial encounter.  EXAM: RIGHT FOOT COMPLETE - 3+ VIEW  COMPARISON:  None.  FINDINGS: There is no evidence of fracture or dislocation. The joint spaces are preserved. There is no evidence of talar subluxation; the subtalar joint is unremarkable in appearance.  No significant soft tissue abnormalities are seen.  IMPRESSION: No evidence of fracture or dislocation.   Electronically Signed   By: Garald Balding M.D.   On: 10/23/2014 02:24    Images viewed by me.  MDM   Final diagnoses:  Sprain of right ankle, initial encounter   Right foot and ankle injury. X-rays of been ordered.  X-rays show no evidence of fracture. She is placed in an ankle splint orthotic and given crutches to use as needed. Advised to use over-the-counter analgesia 6 as needed for pain with given a prescription for oxycodone-acetaminophen for severe pain. Referred to orthopedics for follow-up.  Delora Fuel, MD 96/04/54 [id number]

## 2014-10-23 NOTE — ED Notes (Signed)
Discharge instructions given, pt demonstrated teach back and verbal understanding. No concerns voiced.  

## 2014-10-23 NOTE — Discharge Instructions (Signed)
Take ibuprofen or acetaminophen for less severe pain. Wear ASO (ankle splint orthotic) as needed.   Ankle Sprain An ankle sprain is an injury to the strong, fibrous tissues (ligaments) that hold the bones of your ankle joint together.  CAUSES An ankle sprain is usually caused by a fall or by twisting your ankle. Ankle sprains most commonly occur when you step on the outer edge of your foot, and your ankle turns inward. People who participate in sports are more prone to these types of injuries.  SYMPTOMS   Pain in your ankle. The pain may be present at rest or only when you are trying to stand or walk.  Swelling.  Bruising. Bruising may develop immediately or within 1 to 2 days after your injury.  Difficulty standing or walking, particularly when turning corners or changing directions. DIAGNOSIS  Your caregiver will ask you details about your injury and perform a physical exam of your ankle to determine if you have an ankle sprain. During the physical exam, your caregiver will press on and apply pressure to specific areas of your foot and ankle. Your caregiver will try to move your ankle in certain ways. An X-ray exam may be done to be sure a bone was not broken or a ligament did not separate from one of the bones in your ankle (avulsion fracture).  TREATMENT  Certain types of braces can help stabilize your ankle. Your caregiver can make a recommendation for this. Your caregiver may recommend the use of medicine for pain. If your sprain is severe, your caregiver may refer you to a surgeon who helps to restore function to parts of your skeletal system (orthopedist) or a physical therapist. [place]  1. Apply ice to your injury for 1-2 days or as directed by your caregiver. Applying ice helps to reduce inflammation and pain. 1. Put ice in a plastic bag. 2. Place a towel between your skin and the bag. 3. Leave the ice on for 15-20 minutes at a time, every 2 hours while you are  awake. 2. Only take over-the-counter or prescription medicines for pain, discomfort, or fever as directed by your caregiver. 3. Elevate your injured ankle above the level of your heart as much as possible for 2-3 days. 4. If your caregiver recommends crutches, use them as instructed. Gradually put weight on the affected ankle. Continue to use crutches or a cane until you can walk without feeling pain in your ankle. 5. If you have a plaster splint, wear the splint as directed by your caregiver. Do not rest it on anything harder than a pillow for the first 24 hours. Do not put weight on it. Do not get it wet. You may take it off to take a shower or bath. 6. You may have been given an elastic bandage to wear around your ankle to provide support. If the elastic bandage is too tight (you have numbness or tingling in your foot or your foot becomes cold and blue), adjust the bandage to make it comfortable. 7. If you have an air splint, you may blow more air into it or let air out to make it more comfortable. You may take your splint off at night and before taking a shower or bath. Wiggle your toes in the splint several times per day to decrease swelling. SEEK MEDICAL CARE IF:  1. You have rapidly increasing bruising or swelling. 2. Your toes feel extremely cold or you lose feeling in your foot. 3. Your pain  is not relieved with medicine. SEEK IMMEDIATE MEDICAL CARE IF: 1. Your toes are numb or blue. 2. You have severe pain that is increasing. MAKE SURE YOU:  1. Understand these instructions. 2. Will watch your condition. 3. Will get help right away if you are not doing well or get worse. Document Released: 03/22/2005 Document Revised: 12/15/2011 Document Reviewed: 04/03/2011 [facility] Patient Information 2015 [place], [place]. This information is not intended to replace advice given to you by your health care provider. Make sure you discuss any questions you have with your health care provider.  Crutch  Use Crutches are used to take weight off one of your legs or feet when you stand or walk. It is important to use crutches that fit properly. When fitted properly:  Each crutch should be 2-3 finger widths below the armpit.  Your weight should be supported by your hand, and not by resting the armpit on the crutch.  RISKS AND COMPLICATIONS Damage to the nerves that extend from your armpit to your hand and arm. To prevent this from happening, make sure your crutches fit properly and do not put pressure on your armpit when using them. HOW TO USE YOUR CRUTCHES If you have been instructed to use partial weight bearing, apply (bear) the amount of weight as your health care provider suggests. Do not bear weight in an amount that causes pain to the area of injury. Walking 8. Step with the crutches. 9. Swing the healthy leg slightly ahead of the crutches. Going Up Steps If there is no handrail: 4. Step up with the healthy leg. 5. Step up with the crutches and injured leg. 6. Continue in this way. If there is a handrail: 3. Hold both crutches in one hand. 4. Place your free hand on the handrail. 5. While putting your weight on your arms, lift your healthy leg to the step. 6. Bring the crutches and the injured leg up to that step. 7. Continue in this way. Going Down Steps Be very careful, as going down stairs with crutches is very challenging. If there is no handrail: 4. Step down with the injured leg and crutches. 5. Step down with the healthy leg. If there is a handrail: 1. Place your hand on the handrail. 2. Hold both crutches with your free hand. 3. Lower your injured leg and crutch to the step below you. Make sure to keep the crutch tips in the center of the step, never on the edge. 4. Lower your healthy leg to that step. 5. Continue in this way. Standing Up 1. Hold the injured leg forward. 2. Grab the armrest with one hand and the top of the crutches with the other hand. 3. Using these  supports, pull yourself up to a standing position. Sitting Down 1. Hold the injured leg forward. 2. Grab the armrest with one hand and the top of the crutches with the other hand. 3. Lower yourself to a sitting position. SEEK MEDICAL CARE IF:  You still feel unsteady on your feet.  You develop new pain, for example in your armpits, back, shoulder, wrist, or hip.  You develop any numbness or tingling. SEEK IMMEDIATE MEDICAL CARE IF: You fall. Document Released: 03/19/2000 Document Revised: 03/27/2013 Document Reviewed: 11/27/2012 [facility] Patient Information 2015 [place], [place]. This information is not intended to replace advice given to you by your health care provider. Make sure you discuss any questions you have with your health care provider.  Acetaminophen; Oxycodone tablets What is this medicine? ACETAMINOPHEN;  OXYCODONE (a set a MEE noe fen; ox i KOE done) is a pain reliever. It is used to treat mild to moderate pain. This medicine may be used for other purposes; ask your health care provider or pharmacist if you have questions. COMMON BRAND NAME(S): Endocet, Magnacet, Narvox, Percocet, Perloxx, Primalev, Primlev, Roxicet, Xolox What should I tell my health care provider before I take this medicine? They need to know if you have any of these conditions: -brain tumor -Crohn's disease, inflammatory bowel disease, or ulcerative colitis -drug abuse or addiction -head injury -heart or circulation problems -if you often drink alcohol -kidney disease or problems going to the bathroom -liver disease -lung disease, asthma, or breathing problems -an unusual or allergic reaction to acetaminophen, oxycodone, other opioid analgesics, other medicines, foods, dyes, or preservatives -pregnant or trying to get pregnant -breast-feeding How should I use this medicine? Take this medicine by mouth with a full glass of water. Follow the directions on the prescription label. Take your medicine  at regular intervals. Do not take your medicine more often than directed. Talk to your pediatrician regarding the use of this medicine in children. Special care may be needed. Patients over 16 years old may have a stronger reaction and need a smaller dose. Overdosage: If you think you have taken too much of this medicine contact a poison control center or emergency room at once. NOTE: This medicine is only for you. Do not share this medicine with others. What if I miss a dose? If you miss a dose, take it as soon as you can. If it is almost time for your next dose, take only that dose. Do not take double or extra doses. What may interact with this medicine? -alcohol -antihistamines -barbiturates like amobarbital, butalbital, butabarbital, methohexital, pentobarbital, phenobarbital, thiopental, and secobarbital -benztropine -drugs for bladder problems like solifenacin, trospium, oxybutynin, tolterodine, hyoscyamine, and methscopolamine -drugs for breathing problems like ipratropium and tiotropium -drugs for certain stomach or intestine problems like propantheline, homatropine methylbromide, glycopyrrolate, atropine, belladonna, and dicyclomine -general anesthetics like etomidate, ketamine, nitrous oxide, propofol, desflurane, enflurane, halothane, isoflurane, and sevoflurane -medicines for depression, anxiety, or psychotic disturbances -medicines for sleep -muscle relaxants -naltrexone -narcotic medicines (opiates) for pain -phenothiazines like perphenazine, thioridazine, chlorpromazine, mesoridazine, fluphenazine, prochlorperazine, promazine, and trifluoperazine -scopolamine -tramadol -trihexyphenidyl This list may not describe all possible interactions. Give your health care provider a list of all the medicines, herbs, non-prescription drugs, or dietary supplements you use. Also tell them if you smoke, drink alcohol, or use illegal drugs. Some items may interact with your medicine. What  should I watch for while using this medicine? Tell your doctor or health care professional if your pain does not go away, if it gets worse, or if you have new or a different type of pain. You may develop tolerance to the medicine. Tolerance means that you will need a higher dose of the medication for pain relief. Tolerance is normal and is expected if you take this medicine for a long time. Do not suddenly stop taking your medicine because you may develop a severe reaction. Your body becomes used to the medicine. This does NOT mean you are addicted. Addiction is a behavior related to getting and using a drug for a non-medical reason. If you have pain, you have a medical reason to take pain medicine. Your doctor will tell you how much medicine to take. If your doctor wants you to stop the medicine, the dose will be slowly lowered over time to avoid any  side effects. You may get drowsy or dizzy. Do not drive, use machinery, or do anything that needs mental alertness until you know how this medicine affects you. Do not stand or sit up quickly, especially if you are an older patient. This reduces the risk of dizzy or fainting spells. Alcohol may interfere with the effect of this medicine. Avoid alcoholic drinks. There are different types of narcotic medicines (opiates) for pain. If you take more than one type at the same time, you may have more side effects. Give your health care provider a list of all medicines you use. Your doctor will tell you how much medicine to take. Do not take more medicine than directed. Call emergency for help if you have problems breathing. The medicine will cause constipation. Try to have a bowel movement at least every 2 to 3 days. If you do not have a bowel movement for 3 days, call your doctor or health care professional. Do not take Tylenol (acetaminophen) or medicines that have acetaminophen with this medicine. Too much acetaminophen can be very dangerous. Many nonprescription  medicines contain acetaminophen. Always read the labels carefully to avoid taking more acetaminophen. What side effects may I notice from receiving this medicine? Side effects that you should report to your doctor or health care professional as soon as possible: -allergic reactions like skin rash, itching or hives, swelling of the face, lips, or tongue -breathing difficulties, wheezing -confusion -light headedness or fainting spells -severe stomach pain -unusually weak or tired -yellowing of the skin or the whites of the eyes Side effects that usually do not require medical attention (report to your doctor or health care professional if they continue or are bothersome): -dizziness -drowsiness -nausea -vomiting This list may not describe all possible side effects. Call your doctor for medical advice about side effects. You may report side effects to FDA at 1-800-FDA-1088. Where should I keep my medicine? Keep out of the reach of children. This medicine can be abused. Keep your medicine in a safe place to protect it from theft. Do not share this medicine with anyone. Selling or giving away this medicine is dangerous and against the law. Store at room temperature between 20 and 25 degrees C (68 and 77 degrees F). Keep container tightly closed. Protect from light. This medicine may cause accidental overdose and death if it is taken by other adults, children, or pets. Flush any unused medicine down the toilet to reduce the chance of harm. Do not use the medicine after the expiration date. NOTE: This sheet is a summary. It may not cover all possible information. If you have questions about this medicine, talk to your doctor, pharmacist, or health care provider.  2015, [employer]. (2012-11-13 13:17:35)

## 2014-10-23 NOTE — ED Notes (Signed)
Pt c/o rt ankle pain. Pt has bruising to the toes and swelling to ankle. Pt states she was walking and felt a pop.

## 2014-11-13 ENCOUNTER — Encounter (HOSPITAL_COMMUNITY): Payer: Self-pay

## 2014-11-13 DIAGNOSIS — K529 Noninfective gastroenteritis and colitis, unspecified: Secondary | ICD-10-CM | POA: Insufficient documentation

## 2014-11-13 DIAGNOSIS — Z3202 Encounter for pregnancy test, result negative: Secondary | ICD-10-CM | POA: Diagnosis not present

## 2014-11-13 DIAGNOSIS — Z720 Tobacco use: Secondary | ICD-10-CM | POA: Diagnosis not present

## 2014-11-13 DIAGNOSIS — Z856 Personal history of leukemia: Secondary | ICD-10-CM | POA: Diagnosis not present

## 2014-11-13 DIAGNOSIS — R109 Unspecified abdominal pain: Secondary | ICD-10-CM | POA: Diagnosis present

## 2014-11-13 DIAGNOSIS — R112 Nausea with vomiting, unspecified: Secondary | ICD-10-CM | POA: Insufficient documentation

## 2014-11-13 DIAGNOSIS — N939 Abnormal uterine and vaginal bleeding, unspecified: Secondary | ICD-10-CM | POA: Insufficient documentation

## 2014-11-13 DIAGNOSIS — E876 Hypokalemia: Secondary | ICD-10-CM | POA: Insufficient documentation

## 2014-11-13 DIAGNOSIS — Z8619 Personal history of other infectious and parasitic diseases: Secondary | ICD-10-CM | POA: Diagnosis not present

## 2014-11-13 DIAGNOSIS — N760 Acute vaginitis: Secondary | ICD-10-CM | POA: Diagnosis not present

## 2014-11-13 DIAGNOSIS — Z79899 Other long term (current) drug therapy: Secondary | ICD-10-CM | POA: Insufficient documentation

## 2014-11-13 DIAGNOSIS — Z793 Long term (current) use of hormonal contraceptives: Secondary | ICD-10-CM | POA: Insufficient documentation

## 2014-11-13 NOTE — ED Notes (Signed)
Pt c/o mid abd pain and lower back pain for a week, states she had a period about 2 weeks ago and started bleeding again this morning.  Pt states she does not know if she could have been pregnant.

## 2014-11-14 ENCOUNTER — Emergency Department (HOSPITAL_COMMUNITY): Payer: Medicaid Other

## 2014-11-14 ENCOUNTER — Emergency Department (HOSPITAL_COMMUNITY)
Admission: EM | Admit: 2014-11-14 | Discharge: 2014-11-14 | Disposition: A | Discharge status: Home / Self Care | Payer: Medicaid Other | Attending: Emergency Medicine | Admitting: Emergency Medicine

## 2014-11-14 DIAGNOSIS — K529 Noninfective gastroenteritis and colitis, unspecified: Secondary | ICD-10-CM

## 2014-11-14 DIAGNOSIS — R109 Unspecified abdominal pain: Secondary | ICD-10-CM

## 2014-11-14 DIAGNOSIS — E876 Hypokalemia: Secondary | ICD-10-CM

## 2014-11-14 DIAGNOSIS — N760 Acute vaginitis: Secondary | ICD-10-CM

## 2014-11-14 DIAGNOSIS — B9689 Other specified bacterial agents as the cause of diseases classified elsewhere: Secondary | ICD-10-CM

## 2014-11-14 LAB — COMPREHENSIVE METABOLIC PANEL
ALT: 23 U/L (ref 14–54)
AST: 24 U/L (ref 15–41)
Albumin: 3.5 g/dL (ref 3.5–5.0)
Alkaline Phosphatase: 73 U/L (ref 38–126)
Anion gap: 10 (ref 5–15)
BUN: 8 mg/dL (ref 6–20)
CO2: 28 mmol/L (ref 22–32)
CREATININE: 0.64 mg/dL (ref 0.44–1.00)
Calcium: 8.3 mg/dL — ABNORMAL LOW (ref 8.9–10.3)
Chloride: 98 mmol/L — ABNORMAL LOW (ref 101–111)
GFR calc non Af Amer: >60 mL/min (ref >60)
GLUCOSE: 97 mg/dL (ref 65–99)
Potassium: 2.9 mmol/L — ABNORMAL LOW (ref 3.5–5.1)
Sodium: 136 mmol/L (ref 135–145)
Total Bilirubin: 0.3 mg/dL (ref 0.3–1.2)
Total Protein: 7.6 g/dL (ref 6.5–8.1)

## 2014-11-14 LAB — CBC WITH DIFFERENTIAL/PLATELET
Basophils Absolute: 0 10*3/uL (ref 0.0–0.1)
Basophils Relative: 0 % (ref 0–1)
Eosinophils Absolute: 0.1 10*3/uL (ref 0.0–0.7)
Eosinophils Relative: 1 % (ref 0–5)
HEMATOCRIT: 38.3 % (ref 36.0–46.0)
Hemoglobin: 12.9 g/dL (ref 12.0–15.0)
LYMPHS ABS: 2.7 10*3/uL (ref 0.7–4.0)
LYMPHS PCT: 17 % (ref 12–46)
MCH: 28.5 pg (ref 26.0–34.0)
MCHC: 33.7 g/dL (ref 30.0–36.0)
MCV: 84.7 fL (ref 78.0–100.0)
MONO ABS: 2 10*3/uL — AB (ref 0.1–1.0)
MONOS PCT: 13 % — AB (ref 3–12)
NEUTROS ABS: 10.9 10*3/uL — AB (ref 1.7–7.7)
NEUTROS PCT: 69 % (ref 43–77)
Platelets: 373 10*3/uL (ref 150–400)
RBC: 4.52 MIL/uL (ref 3.87–5.11)
RDW: 13.1 % (ref 11.5–15.5)
WBC: 15.7 10*3/uL — AB (ref 4.0–10.5)

## 2014-11-14 LAB — URINALYSIS, ROUTINE W REFLEX MICROSCOPIC
Glucose, UA: NEGATIVE mg/dL
Ketones, ur: 15 mg/dL — AB
Nitrite: POSITIVE — AB
Specific Gravity, Urine: 1.025 (ref 1.005–1.030)
Urobilinogen, UA: 1 mg/dL (ref 0.0–1.0)
pH: 6.5 (ref 5.0–8.0)

## 2014-11-14 LAB — WET PREP, GENITAL
Trich, Wet Prep: NONE SEEN
YEAST WET PREP: NONE SEEN

## 2014-11-14 LAB — URINE MICROSCOPIC-ADD ON

## 2014-11-14 LAB — PREGNANCY, URINE: Preg Test, Ur: NEGATIVE

## 2014-11-14 LAB — LIPASE, BLOOD: Lipase: 10 U/L — ABNORMAL LOW (ref 22–51)

## 2014-11-14 MED ORDER — AZITHROMYCIN 1 G PO PACK
1.0000 g | PACK | Freq: Once | ORAL | Status: AC
  Administered 2014-11-14: 1 g via ORAL
  Filled 2014-11-14: qty 1

## 2014-11-14 MED ORDER — MORPHINE SULFATE 4 MG/ML IJ SOLN
4.0000 mg | Freq: Once | INTRAMUSCULAR | Status: AC
  Administered 2014-11-14: 4 mg via INTRAVENOUS
  Filled 2014-11-14: qty 1

## 2014-11-14 MED ORDER — POTASSIUM CHLORIDE 10 MEQ/100ML IV SOLN
10.0000 meq | Freq: Once | INTRAVENOUS | Status: AC
  Administered 2014-11-14: 10 meq via INTRAVENOUS
  Filled 2014-11-14: qty 100

## 2014-11-14 MED ORDER — CEFTRIAXONE SODIUM 1 G IJ SOLR
1.0000 g | Freq: Once | INTRAMUSCULAR | Status: AC
  Administered 2014-11-14: 1 g via INTRAVENOUS
  Filled 2014-11-14: qty 10

## 2014-11-14 MED ORDER — IOHEXOL 300 MG/ML  SOLN
50.0000 mL | Freq: Once | INTRAMUSCULAR | Status: AC | PRN
  Administered 2014-11-14: 50 mL via ORAL

## 2014-11-14 MED ORDER — IOHEXOL 300 MG/ML  SOLN
100.0000 mL | Freq: Once | INTRAMUSCULAR | Status: AC | PRN
  Administered 2014-11-14: 100 mL via INTRAVENOUS

## 2014-11-14 MED ORDER — OXYCODONE-ACETAMINOPHEN 5-325 MG PO TABS: 1.0000 | ORAL_TABLET | ORAL | Status: DC | PRN

## 2014-11-14 MED ORDER — METRONIDAZOLE 500 MG PO TABS: 500.0000 mg | ORAL_TABLET | Freq: Three times a day (TID) | ORAL | Status: DC

## 2014-11-14 MED ORDER — ONDANSETRON HCL 4 MG/2ML IJ SOLN
4.0000 mg | Freq: Once | INTRAMUSCULAR | Status: AC
  Administered 2014-11-14: 4 mg via INTRAVENOUS
  Filled 2014-11-14: qty 2

## 2014-11-14 MED ORDER — POTASSIUM CHLORIDE CRYS ER 20 MEQ PO TBCR
40.0000 meq | EXTENDED_RELEASE_TABLET | Freq: Once | ORAL | Status: AC
  Administered 2014-11-14: 40 meq via ORAL
  Filled 2014-11-14: qty 2

## 2014-11-14 MED ORDER — DOXYCYCLINE HYCLATE 100 MG PO TABS
100.0000 mg | ORAL_TABLET | Freq: Once | ORAL | Status: AC
  Administered 2014-11-14: 100 mg via ORAL
  Filled 2014-11-14: qty 1

## 2014-11-14 MED ORDER — POTASSIUM CHLORIDE CRYS ER 20 MEQ PO TBCR: 20.0000 meq | EXTENDED_RELEASE_TABLET | Freq: Two times a day (BID) | ORAL | Status: DC

## 2014-11-14 MED ORDER — DOXYCYCLINE HYCLATE 100 MG PO CAPS: 100.0000 mg | ORAL_CAPSULE | Freq: Two times a day (BID) | ORAL | Status: DC

## 2014-11-14 MED ORDER — METRONIDAZOLE 500 MG PO TABS
500.0000 mg | ORAL_TABLET | Freq: Once | ORAL | Status: AC
  Administered 2014-11-14: 500 mg via ORAL
  Filled 2014-11-14: qty 1

## 2014-11-14 NOTE — ED Provider Notes (Signed)
CSN: [id number]     Arrival date & time 11/13/14  2326 History  This chart was scribed for Delora Fuel, MD by Randa Evens, ED Scribe. This patient was seen in room APA11/[facility]11 and the patient's care was started at 12:14 AM.    Chief Complaint  Patient presents with  . Abdominal Pain   Patient is a 24 y.o. female presenting with abdominal pain. The history is provided by the patient. No language interpreter was used.  Abdominal Pain Associated symptoms: chills, diarrhea, fever, nausea, vaginal bleeding and vomiting   Associated symptoms: no vaginal discharge    HPI Comments: Jennifer Krause is a 24 y.o. female who presents to the Emergency Department complaining of intermittent sharp abdominal pain onset 4 days prior. Pt rates the severity of pain 6/10. Pt reports associated vaginal bleeding, nausea, vomiting, diarrhea, fever and chills.  Pt states that she is going through about 10 pads per day. Pt states that the pain is worse when eating, standing or having a BM. Pt states that the pain improves temporarily after vomiting. Pt states that she has tried ibuprofen with slight relief. Denies breast swelling, vaginal discharge, back pain. LMP 2 weeks prior.   Past Medical History  Diagnosis Date  . Pregnancy as incidental finding   . Leukemia   . HSV-2 (herpes simplex virus 2) infection    Past Surgical History  Procedure Laterality Date  . No past surgeries    . Leukemia     Family History  Problem Relation Age of Onset  . Hypertension Mother   . Diabetes Other    Social History  Substance Use Topics  . Smoking status: Current Some Day Smoker    Types: Cigarettes  . Smokeless tobacco: Never Used  . Alcohol Use: Yes     Comment: has a beer every now and then.   OB History    Gravida Para Term Preterm AB TAB SAB Ectopic Multiple Living   1 1 1  0 0 0 0 0 0 1     Review of Systems  Constitutional: Positive for fever and chills.  Gastrointestinal: Positive for nausea,  vomiting, abdominal pain and diarrhea.  Genitourinary: Positive for vaginal bleeding. Negative for vaginal discharge.  All other systems reviewed and are negative.     Allergies  Amphotericin b  Home Medications   Prior to Admission medications   Medication Sig Start Date End Date Taking? Authorizing Provider  famotidine (PEPCID) 20 MG tablet Take 20 mg by mouth 2 (two) times daily. As needed for acid reflux    Historical Provider, MD  Norethindrone-Ethinyl Estradiol-Fe Biphas (LO LOESTRIN FE) 1 MG-10 MCG / 10 MCG tablet Take 1 tablet by mouth daily. 07/11/12   Florian Buff, MD  oxyCODONE-acetaminophen (PERCOCET) 5-325 MG per tablet Take 1 tablet by mouth every 4 (four) hours as needed for moderate pain. 9/79/89   Delora Fuel, MD  Prenatal Vit-Fe Fumarate-FA (PRENATAL MULTIVITAMIN) TABS Take 1 tablet by mouth daily.    Historical Provider, MD   BP 134/68 mmHg  Pulse 113  Temp(Src) 99.4 F (37.4 C) (Oral)  Resp 20  Ht 5' 4.5" (1.638 m)  Wt 180 lb (81.647 kg)  BMI 30.43 kg/m2  SpO2 100%  LMP 10/09/2014   Physical Exam   24 year old female, resting comfortably and in no acute distress. Vital signs are significant for tachycardia. Oxygen saturation is 100%, which is normal. Head is normocephalic and atraumatic. PERRLA, EOMI. Oropharynx is clear. Neck is nontender  and supple without adenopathy or JVD. Back is nontender and there is no CVA tenderness. Lungs are clear without rales, wheezes, or rhonchi. Chest is nontender. Heart has regular rate and rhythm without murmur. Abdomen is soft, flat, with tenderness across the lower abdomen with maximum tenderness in the right lower quadrant. There are no masses or hepatosplenomegaly and peristalsis is normoactive. Pelvic: Normal external female genitalia. Small amount of blood present in the vaginal vault. Cervix is closed. Fundus is 6-8 week size with mild tenderness. There is no cervical motion tenderness. There is no left adnexal  tenderness. There is mild right up next with tenderness. Maximum tenderness is clearly outside the pelvis. No adnexal masses are felt. Extremities have no cyanosis or edema, full range of motion is present. Skin is warm and dry without rash. Neurologic: Mental status is normal, cranial nerves are intact, there are no motor or sensory deficits.  ED Course  Procedures (including critical care time) DIAGNOSTIC STUDIES: Oxygen Saturation is 100% on RA, normal by my interpretation.    COORDINATION OF CARE: 12:21 AM-Discussed treatment plan with pt at bedside and pt agreed to plan.     Labs Review Results for orders placed or performed during the hospital encounter of 11/14/14  Wet prep, genital  Result Value Ref Range   Yeast Wet Prep HPF POC NONE SEEN NONE SEEN   Trich, Wet Prep NONE SEEN NONE SEEN   Clue Cells Wet Prep HPF POC MANY (A) NONE SEEN   WBC, Wet Prep HPF POC MODERATE (A) NONE SEEN  Urinalysis, Routine w reflex microscopic (not at [facility])  Result Value Ref Range   Color, Urine RED (A) YELLOW   APPearance CLOUDY (A) CLEAR   Specific Gravity, Urine 1.025 1.005 - 1.030   pH 6.5 5.0 - 8.0   Glucose, UA NEGATIVE NEGATIVE mg/dL   Hgb urine dipstick LARGE (A) NEGATIVE   Bilirubin Urine LARGE (A) NEGATIVE   Ketones, ur 15 (A) NEGATIVE mg/dL   Protein, ur >300 (A) NEGATIVE mg/dL   Urobilinogen, UA 1.0 0.0 - 1.0 mg/dL   Nitrite POSITIVE (A) NEGATIVE   Leukocytes, UA MODERATE (A) NEGATIVE  Pregnancy, urine  Result Value Ref Range   Preg Test, Ur NEGATIVE NEGATIVE  Comprehensive metabolic panel  Result Value Ref Range   Sodium 136 135 - 145 mmol/L   Potassium 2.9 (L) 3.5 - 5.1 mmol/L   Chloride 98 (L) 101 - 111 mmol/L   CO2 28 22 - 32 mmol/L   Glucose, Bld 97 65 - 99 mg/dL   BUN 8 6 - 20 mg/dL   Creatinine, Ser 0.64 0.44 - 1.00 mg/dL   Calcium 8.3 (L) 8.9 - 10.3 mg/dL   Total Protein 7.6 6.5 - 8.1 g/dL   Albumin 3.5 3.5 - 5.0 g/dL   AST 24 15 - 41 U/L   ALT 23 14 - 54  U/L   Alkaline Phosphatase 73 38 - 126 U/L   Total Bilirubin 0.3 0.3 - 1.2 mg/dL   GFR calc non Af Amer >60 >60 mL/min   GFR calc Af Amer >60 >60 mL/min   Anion gap 10 5 - 15  CBC with Differential  Result Value Ref Range   WBC 15.7 (H) 4.0 - 10.5 K/uL   RBC 4.52 3.87 - 5.11 MIL/uL   Hemoglobin 12.9 12.0 - 15.0 g/dL   HCT 38.3 36.0 - 46.0 %   MCV 84.7 78.0 - 100.0 fL   MCH 28.5 26.0 - 34.0 pg  MCHC 33.7 30.0 - 36.0 g/dL   RDW 13.1 11.5 - 15.5 %   Platelets 373 150 - 400 K/uL   Neutrophils Relative % 69 43 - 77 %   Neutro Abs 10.9 (H) 1.7 - 7.7 K/uL   Lymphocytes Relative 17 12 - 46 %   Lymphs Abs 2.7 0.7 - 4.0 K/uL   Monocytes Relative 13 (H) 3 - 12 %   Monocytes Absolute 2.0 (H) 0.1 - 1.0 K/uL   Eosinophils Relative 1 0 - 5 %   Eosinophils Absolute 0.1 0.0 - 0.7 K/uL   Basophils Relative 0 0 - 1 %   Basophils Absolute 0.0 0.0 - 0.1 K/uL  Lipase, blood  Result Value Ref Range   Lipase 10 (L) 22 - 51 U/L  Urine microscopic-add on  Result Value Ref Range   Squamous Epithelial / LPF FEW (A) RARE   RBC / HPF TOO NUMEROUS TO COUNT <3 RBC/hpf   Bacteria, UA MANY (A) RARE    Imaging Review Ct Abdomen Pelvis W Contrast  11/14/2014   CLINICAL DATA:  Intermittent sharp abdominal pain for 4 days. Vaginal bleeding, nausea, vomiting, diarrhea, fevers and chills. History of leukemia.  EXAM: CT ABDOMEN AND PELVIS WITH CONTRAST  TECHNIQUE: Multidetector CT imaging of the abdomen and pelvis was performed using the standard protocol following bolus administration of intravenous contrast.  CONTRAST:  14mL OMNIPAQUE IOHEXOL 300 MG/ML SOLN, 149mL OMNIPAQUE IOHEXOL 300 MG/ML SOLN  COMPARISON:  Lumbar spine radiograph June 26, 2014  FINDINGS: LUNG BASES: Included view of the lung bases are clear. Visualized heart and pericardium are unremarkable.  SOLID ORGANS: The liver, spleen, gallbladder, pancreas and adrenal glands are unremarkable.  GASTROINTESTINAL TRACT: Multiple loops of mildly thickened,  edematous small bowel LEFT hyperemia. Enteric contrast has not yet reached the distal small bowel. The stomach, large bowel are normal in course and caliber without inflammatory changes. Normal appendix.  KIDNEYS/ URINARY TRACT: Kidneys are orthotopic, demonstrating symmetric enhancement. No nephrolithiasis, hydronephrosis or solid renal masses. The unopacified ureters are normal in course and caliber. Urinary bladder is partially distended and unremarkable.  PERITONEUM/RETROPERITONEUM: Aortoiliac vessels are normal in course and caliber. No lymphadenopathy by CT size criteria. Heterogeneously enhancing LEFT ovary. Small tubular cystic structure RIGHT pelvis (coronal 45/88). Small amount of subhepatic ascites, free fluid in the mesenteric with edema and and, small amount of free fluid in the pelvis .  SOFT TISSUE/OSSEOUS STRUCTURES: Non-suspicious. Moderate RIGHT sacroiliac osteoarthrosis.  IMPRESSION: Small bowel wall thickening and inflammation consistent with enteritis which may be infectious or inflammatory. Small amount of ascites. No bowel obstruction.  Small tubular fluid-filled structure in pelvis, in addition to heterogeneously enhancing LEFT adnexae, which can be seen with pelvic inflammatory disease, possible RIGHT hydro/pyosalpinx.   Electronically Signed   By: Elon Alas M.D.   On: 11/14/2014 03:18   Images viewed by me.   MDM   Final diagnoses:  Abdominal pain, unspecified abdominal location  Enteritis  Hypokalemia  Bacterial vaginosis    Right lower quadrant pain with pelvic exam not suggestive of GYN June of pain. She will be sent for CT of abdomen and pelvis.  CT shows evidence of small bowel inflammation which probably accounts for her pain. However, there is also note of possible Hydro/pyosalpinx on the right and enhancement on the left adnexa consistent with pelvic inflammatory disease. Accordingly, I will treat her for both PID and enteritis. She is given a dose of  ceftriaxone and oral azithromycin in the ED and  will be discharged with prescriptions for doxycycline and metronidazole. Although not classic treatment for enteritis, doxycycline should give similar coverage to the more typically prescribed ciprofloxacin. She will be referred to her gynecologist for follow-up. Also, she is noted to be hypokalemic. She was given oral and IV potassium. She is discharged with prescription for K-Dur and oxycodone-acetaminophen.   I personally performed the services described in this documentation, which was scribed in my presence. The recorded information has been reviewed and is accurate.       Delora Fuel, MD 99/24/26 [id number]

## 2014-11-14 NOTE — Discharge Instructions (Signed)
Your CT scan showed evidence of infection in your bowel, but also possible infection in your pelvic organs. You're being given antibody switch should treat both of those. However, you will need to see your gynecologist for further evaluation. Return to emergency department if pain is not being adequately controlled, he start running a fever, or start vomiting.   Abdominal Pain, Women Abdominal (stomach, pelvic, or belly) pain can be caused by many things. It is important to tell your doctor:  The location of the pain.  Does it come and go or is it present all the time?  Are there things that start the pain (eating certain foods, exercise)?  Are there other symptoms associated with the pain (fever, nausea, vomiting, diarrhea)? All of this is helpful to know when trying to find the cause of the pain. CAUSES   Stomach: virus or bacteria infection, or ulcer.  Intestine: appendicitis (inflamed appendix), regional ileitis (Crohn's disease), ulcerative colitis (inflamed colon), irritable bowel syndrome, diverticulitis (inflamed diverticulum of the colon), or cancer of the stomach or intestine.  Gallbladder disease or stones in the gallbladder.  Kidney disease, kidney stones, or infection.  Pancreas infection or cancer.  Fibromyalgia (pain disorder).  Diseases of the female organs:  Uterus: fibroid (non-cancerous) tumors or infection.  Fallopian tubes: infection or tubal pregnancy.  Ovary: cysts or tumors.  Pelvic adhesions (scar tissue).  Endometriosis (uterus lining tissue growing in the pelvis and on the pelvic organs).  Pelvic congestion syndrome (female organs filling up with blood just before the menstrual period).  Pain with the menstrual period.  Pain with ovulation (producing an egg).  Pain with an IUD (intrauterine device, birth control) in the uterus.  Cancer of the female organs.  Functional pain (pain not caused by a disease, may improve without  treatment).  Psychological pain.  Depression. DIAGNOSIS  Your doctor will decide the seriousness of your pain by doing an examination.  Blood tests.  X-rays.  Ultrasound.  CT scan (computed tomography, special type of X-ray).  MRI (magnetic resonance imaging).  Cultures, for infection.  Barium enema (dye inserted in the large intestine, to better view it with X-rays).  Colonoscopy (looking in intestine with a lighted tube).  Laparoscopy (minor surgery, looking in abdomen with a lighted tube).  Major abdominal exploratory surgery (looking in abdomen with a large incision). TREATMENT  The treatment will depend on the cause of the pain.   Many cases can be observed and treated at home.  Over-the-counter medicines recommended by your caregiver.  Prescription medicine.  Antibiotics, for infection.  Birth control pills, for painful periods or for ovulation pain.  Hormone treatment, for endometriosis.  Nerve blocking injections.  Physical therapy.  Antidepressants.  Counseling with a psychologist or psychiatrist.  Minor or major surgery. HOME CARE INSTRUCTIONS   Do not take laxatives, unless directed by your caregiver.  Take over-the-counter pain medicine only if ordered by your caregiver. Do not take aspirin because it can cause an upset stomach or bleeding.  Try a clear liquid diet (broth or water) as ordered by your caregiver. Slowly move to a bland diet, as tolerated, if the pain is related to the stomach or intestine.  Have a thermometer and take your temperature several times a day, and record it.  Bed rest and sleep, if it helps the pain.  Avoid sexual intercourse, if it causes pain.  Avoid stressful situations.  Keep your follow-up appointments and tests, as your caregiver orders.  If the pain does not go  away with medicine or surgery, you may try:  Acupuncture.  Relaxation exercises (yoga, meditation).  Group therapy.  Counseling. SEEK  MEDICAL CARE IF:   You notice certain foods cause stomach pain.  Your home care treatment is not helping your pain.  You need stronger pain medicine.  You want your IUD removed.  You feel faint or lightheaded.  You develop nausea and vomiting.  You develop a rash.  You are having side effects or an allergy to your medicine. SEEK IMMEDIATE MEDICAL CARE IF:   Your pain does not go away or gets worse.  You have a fever.  Your pain is felt only in portions of the abdomen. The right side could possibly be appendicitis. The left lower portion of the abdomen could be colitis or diverticulitis.  You are passing blood in your stools (bright red or black tarry stools, with or without vomiting).  You have blood in your urine.  You develop chills, with or without a fever.  You pass out. MAKE SURE YOU:   Understand these instructions.  Will watch your condition.  Will get help right away if you are not doing well or get worse. Document Released: 01/17/2007 Document Revised: 08/06/2013 Document Reviewed: 02/06/2009 [facility] Patient Information 2015 [place], [place]. This information is not intended to replace advice given to you by your health care provider. Make sure you discuss any questions you have with your health care provider.  Hypokalemia Hypokalemia means that the amount of potassium in the blood is lower than normal.Potassium is a chemical, called an electrolyte, that helps regulate the amount of fluid in the body. It also stimulates muscle contraction and helps nerves function properly.Most of the body's potassium is inside of cells, and only a very small amount is in the blood. Because the amount in the blood is so small, minor changes can be life-threatening. CAUSES  Antibiotics.  Diarrhea or vomiting.  Using laxatives too much, which can cause diarrhea.  Chronic kidney disease.  Water pills (diuretics).  Eating disorders (bulimia).  Low magnesium  level.  Sweating a lot. SIGNS AND SYMPTOMS  Weakness.  Constipation.  Fatigue.  Muscle cramps.  Mental confusion.  Skipped heartbeats or irregular heartbeat (palpitations).  Tingling or numbness. DIAGNOSIS  Your health care provider can diagnose hypokalemia with blood tests. In addition to checking your potassium level, your health care provider may also check other lab tests. TREATMENT Hypokalemia can be treated with potassium supplements taken by mouth or adjustments in your current medicines. If your potassium level is very low, you may need to get potassium through a vein (IV) and be monitored in the hospital. A diet high in potassium is also helpful. Foods high in potassium are:  Nuts, such as peanuts and pistachios.  Seeds, such as sunflower seeds and pumpkin seeds.  Peas, lentils, and lima beans.  Whole grain and bran cereals and breads.  Fresh fruit and vegetables, such as apricots, avocado, bananas, cantaloupe, kiwi, oranges, tomatoes, asparagus, and potatoes.  Orange and tomato juices.  Red meats.  Fruit yogurt. HOME CARE INSTRUCTIONS  Take all medicines as prescribed by your health care provider.  Maintain a healthy diet by including nutritious food, such as fruits, vegetables, nuts, whole grains, and lean meats.  If you are taking a laxative, be sure to follow the directions on the label. SEEK MEDICAL CARE IF:  Your weakness gets worse.  You feel your heart pounding or racing.  You are vomiting or having diarrhea.  You are diabetic  and having trouble keeping your blood glucose in the normal range. SEEK IMMEDIATE MEDICAL CARE IF:  You have chest pain, shortness of breath, or dizziness.  You are vomiting or having diarrhea for more than 2 days.  You faint. MAKE SURE YOU:   Understand these instructions.  Will watch your condition.  Will get help right away if you are not doing well or get worse. Document Released: 03/22/2005 Document  Revised: 01/10/2013 Document Reviewed: 09/22/2012 [facility] Patient Information 2015 [place], [place]. This information is not intended to replace advice given to you by your health care provider. Make sure you discuss any questions you have with your health care provider.  Doxycycline tablets or capsules What is this medicine? DOXYCYCLINE (dox i SYE kleen) is a tetracycline antibiotic. It kills certain bacteria or stops their growth. It is used to treat many kinds of infections, like dental, skin, respiratory, and urinary tract infections. It also treats acne, Lyme disease, malaria, and certain sexually transmitted infections. This medicine may be used for other purposes; ask your health care provider or pharmacist if you have questions. COMMON BRAND NAME(S): Acticlate, Adoxa, Adoxa CK, Adoxa Pak, Adoxa TT, Alodox, Avidoxy, Doxal, Monodox, Morgidox 1x, Morgidox 1x Kit, Morgidox 2x, Morgidox 2x Kit, Ocudox, Vibra-Tabs, Vibramycin What should I tell my health care provider before I take this medicine? They need to know if you have any of these conditions: -liver disease -long exposure to sunlight like working outdoors -stomach problems like colitis -an unusual or allergic reaction to doxycycline, tetracycline antibiotics, other medicines, foods, dyes, or preservatives -pregnant or trying to get pregnant -breast-feeding How should I use this medicine? Take this medicine by mouth with a full glass of water. Follow the directions on the prescription label. It is best to take this medicine without food, but if it upsets your stomach take it with food. Take your medicine at regular intervals. Do not take your medicine more often than directed. Take all of your medicine as directed even if you think you are better. Do not skip doses or stop your medicine early. Talk to your pediatrician regarding the use of this medicine in children. Special care may be needed. While this drug may be prescribed for children  as young as 41 years old for selected conditions, precautions do apply. Overdosage: If you think you have taken too much of this medicine contact a poison control center or emergency room at once. NOTE: This medicine is only for you. Do not share this medicine with others. What if I miss a dose? If you miss a dose, take it as soon as you can. If it is almost time for your next dose, take only that dose. Do not take double or extra doses. What may interact with this medicine? -antacids -barbiturates -birth control pills -bismuth subsalicylate -carbamazepine -methoxyflurane -other antibiotics -phenytoin -vitamins that contain iron -warfarin This list may not describe all possible interactions. Give your health care provider a list of all the medicines, herbs, non-prescription drugs, or dietary supplements you use. Also tell them if you smoke, drink alcohol, or use illegal drugs. Some items may interact with your medicine. What should I watch for while using this medicine? Tell your doctor or health care professional if your symptoms do not improve. Do not treat diarrhea with over the counter products. Contact your doctor if you have diarrhea that lasts more than 2 days or if it is severe and watery. Do not take this medicine just before going to bed. It may  not dissolve properly when you lay down and can cause pain in your throat. Drink plenty of fluids while taking this medicine to also help reduce irritation in your throat. This medicine can make you more sensitive to the sun. Keep out of the sun. If you cannot avoid being in the sun, wear protective clothing and use sunscreen. Do not use sun lamps or tanning beds/booths. Birth control pills may not work properly while you are taking this medicine. Talk to your doctor about using an extra method of birth control. If you are being treated for a sexually transmitted infection, avoid sexual contact until you have finished your treatment. Your  sexual partner may also need treatment. Avoid antacids, aluminum, calcium, magnesium, and iron products for 4 hours before and 2 hours after taking a dose of this medicine. If you are using this medicine to prevent malaria, you should still protect yourself from contact with mosquitos. Stay in screened-in areas, use mosquito nets, keep your body covered, and use an insect repellent. What side effects may I notice from receiving this medicine? Side effects that you should report to your doctor or health care professional as soon as possible: -allergic reactions like skin rash, itching or hives, swelling of the face, lips, or tongue -difficulty breathing -fever -itching in the rectal or genital area -pain on swallowing -redness, blistering, peeling or loosening of the skin, including inside the mouth -severe stomach pain or cramps -unusual bleeding or bruising -unusually weak or tired -yellowing of the eyes or skin Side effects that usually do not require medical attention (report to your doctor or health care professional if they continue or are bothersome): -diarrhea -loss of appetite -nausea, vomiting This list may not describe all possible side effects. Call your doctor for medical advice about side effects. You may report side effects to FDA at 1-800-FDA-1088. Where should I keep my medicine? Keep out of the reach of children. Store at room temperature, below 30 degrees C (86 degrees F). Protect from light. Keep container tightly closed. Throw away any unused medicine after the expiration date. Taking this medicine after the expiration date can make you seriously ill. NOTE: This sheet is a summary. It may not cover all possible information. If you have questions about this medicine, talk to your doctor, pharmacist, or health care provider.  2015, [employer]. (2013-01-26 13:58:06)  Metronidazole tablets or capsules What is this medicine? METRONIDAZOLE (me troe NI da zole)  is an antiinfective. It is used to treat certain kinds of bacterial and protozoal infections. It will not work for colds, flu, or other viral infections. This medicine may be used for other purposes; ask your health care provider or pharmacist if you have questions. COMMON BRAND NAME(S): Flagyl What should I tell my health care provider before I take this medicine? They need to know if you have any of these conditions: -anemia or other blood disorders -disease of the nervous system -fungal or yeast infection -if you drink alcohol containing drinks -liver disease -seizures -an unusual or allergic reaction to metronidazole, or other medicines, foods, dyes, or preservatives -pregnant or trying to get pregnant -breast-feeding How should I use this medicine? Take this medicine by mouth with a full glass of water. Follow the directions on the prescription label. Take your medicine at regular intervals. Do not take your medicine more often than directed. Take all of your medicine as directed even if you think you are better. Do not skip doses or stop your medicine early. Talk  to your pediatrician regarding the use of this medicine in children. Special care may be needed. Overdosage: If you think you have taken too much of this medicine contact a poison control center or emergency room at once. NOTE: This medicine is only for you. Do not share this medicine with others. What if I miss a dose? If you miss a dose, take it as soon as you can. If it is almost time for your next dose, take only that dose. Do not take double or extra doses. What may interact with this medicine? Do not take this medicine with any of the following medications: -alcohol or any product that contains alcohol -amprenavir oral solution -cisapride -disulfiram -dofetilide -dronedarone -paclitaxel injection -pimozide -ritonavir oral solution -sertraline oral solution -sulfamethoxazole-trimethoprim  injection -thioridazine -ziprasidone This medicine may also interact with the following medications: -birth control pills -cimetidine -lithium -other medicines that prolong the QT interval (cause an abnormal heart rhythm) -phenobarbital -phenytoin -warfarin This list may not describe all possible interactions. Give your health care provider a list of all the medicines, herbs, non-prescription drugs, or dietary supplements you use. Also tell them if you smoke, drink alcohol, or use illegal drugs. Some items may interact with your medicine. What should I watch for while using this medicine? Tell your doctor or health care professional if your symptoms do not improve or if they get worse. You may get drowsy or dizzy. Do not drive, use machinery, or do anything that needs mental alertness until you know how this medicine affects you. Do not stand or sit up quickly, especially if you are an older patient. This reduces the risk of dizzy or fainting spells. Avoid alcoholic drinks while you are taking this medicine and for three days afterward. Alcohol may make you feel dizzy, sick, or flushed. If you are being treated for a sexually transmitted disease, avoid sexual contact until you have finished your treatment. Your sexual partner may also need treatment. What side effects may I notice from receiving this medicine? Side effects that you should report to your doctor or health care professional as soon as possible: -allergic reactions like skin rash or hives, swelling of the face, lips, or tongue -confusion, clumsiness -difficulty speaking -discolored or sore mouth -dizziness -fever, infection -numbness, tingling, pain or weakness in the hands or feet -trouble passing urine or change in the amount of urine -redness, blistering, peeling or loosening of the skin, including inside the mouth -seizures -unusually weak or tired -vaginal irritation, dryness, or discharge Side effects that usually do  not require medical attention (report to your doctor or health care professional if they continue or are bothersome): -diarrhea -headache -irritability -metallic taste -nausea -stomach pain or cramps -trouble sleeping This list may not describe all possible side effects. Call your doctor for medical advice about side effects. You may report side effects to FDA at 1-800-FDA-1088. Where should I keep my medicine? Keep out of the reach of children. Store at room temperature below 25 degrees C (77 degrees F). Protect from light. Keep container tightly closed. Throw away any unused medicine after the expiration date. NOTE: This sheet is a summary. It may not cover all possible information. If you have questions about this medicine, talk to your doctor, pharmacist, or health care provider.  2015, [employer]. (2012-10-27 14:08:39)  Acetaminophen; Oxycodone tablets What is this medicine? ACETAMINOPHEN; OXYCODONE (a set a MEE noe fen; ox i KOE done) is a pain reliever. It is used to treat mild to moderate pain.  This medicine may be used for other purposes; ask your health care provider or pharmacist if you have questions. COMMON BRAND NAME(S): Endocet, Magnacet, Narvox, Percocet, Perloxx, Primalev, Primlev, Roxicet, Xolox What should I tell my health care provider before I take this medicine? They need to know if you have any of these conditions: -brain tumor -Crohn's disease, inflammatory bowel disease, or ulcerative colitis -drug abuse or addiction -head injury -heart or circulation problems -if you often drink alcohol -kidney disease or problems going to the bathroom -liver disease -lung disease, asthma, or breathing problems -an unusual or allergic reaction to acetaminophen, oxycodone, other opioid analgesics, other medicines, foods, dyes, or preservatives -pregnant or trying to get pregnant -breast-feeding How should I use this medicine? Take this medicine by mouth with a  full glass of water. Follow the directions on the prescription label. Take your medicine at regular intervals. Do not take your medicine more often than directed. Talk to your pediatrician regarding the use of this medicine in children. Special care may be needed. Patients over 52 years old may have a stronger reaction and need a smaller dose. Overdosage: If you think you have taken too much of this medicine contact a poison control center or emergency room at once. NOTE: This medicine is only for you. Do not share this medicine with others. What if I miss a dose? If you miss a dose, take it as soon as you can. If it is almost time for your next dose, take only that dose. Do not take double or extra doses. What may interact with this medicine? -alcohol -antihistamines -barbiturates like amobarbital, butalbital, butabarbital, methohexital, pentobarbital, phenobarbital, thiopental, and secobarbital -benztropine -drugs for bladder problems like solifenacin, trospium, oxybutynin, tolterodine, hyoscyamine, and methscopolamine -drugs for breathing problems like ipratropium and tiotropium -drugs for certain stomach or intestine problems like propantheline, homatropine methylbromide, glycopyrrolate, atropine, belladonna, and dicyclomine -general anesthetics like etomidate, ketamine, nitrous oxide, propofol, desflurane, enflurane, halothane, isoflurane, and sevoflurane -medicines for depression, anxiety, or psychotic disturbances -medicines for sleep -muscle relaxants -naltrexone -narcotic medicines (opiates) for pain -phenothiazines like perphenazine, thioridazine, chlorpromazine, mesoridazine, fluphenazine, prochlorperazine, promazine, and trifluoperazine -scopolamine -tramadol -trihexyphenidyl This list may not describe all possible interactions. Give your health care provider a list of all the medicines, herbs, non-prescription drugs, or dietary supplements you use. Also tell them if you smoke,  drink alcohol, or use illegal drugs. Some items may interact with your medicine. What should I watch for while using this medicine? Tell your doctor or health care professional if your pain does not go away, if it gets worse, or if you have new or a different type of pain. You may develop tolerance to the medicine. Tolerance means that you will need a higher dose of the medication for pain relief. Tolerance is normal and is expected if you take this medicine for a long time. Do not suddenly stop taking your medicine because you may develop a severe reaction. Your body becomes used to the medicine. This does NOT mean you are addicted. Addiction is a behavior related to getting and using a drug for a non-medical reason. If you have pain, you have a medical reason to take pain medicine. Your doctor will tell you how much medicine to take. If your doctor wants you to stop the medicine, the dose will be slowly lowered over time to avoid any side effects. You may get drowsy or dizzy. Do not drive, use machinery, or do anything that needs mental alertness until you know how  this medicine affects you. Do not stand or sit up quickly, especially if you are an older patient. This reduces the risk of dizzy or fainting spells. Alcohol may interfere with the effect of this medicine. Avoid alcoholic drinks. There are different types of narcotic medicines (opiates) for pain. If you take more than one type at the same time, you may have more side effects. Give your health care provider a list of all medicines you use. Your doctor will tell you how much medicine to take. Do not take more medicine than directed. Call emergency for help if you have problems breathing. The medicine will cause constipation. Try to have a bowel movement at least every 2 to 3 days. If you do not have a bowel movement for 3 days, call your doctor or health care professional. Do not take Tylenol (acetaminophen) or medicines that have acetaminophen  with this medicine. Too much acetaminophen can be very dangerous. Many nonprescription medicines contain acetaminophen. Always read the labels carefully to avoid taking more acetaminophen. What side effects may I notice from receiving this medicine? Side effects that you should report to your doctor or health care professional as soon as possible: -allergic reactions like skin rash, itching or hives, swelling of the face, lips, or tongue -breathing difficulties, wheezing -confusion -light headedness or fainting spells -severe stomach pain -unusually weak or tired -yellowing of the skin or the whites of the eyes Side effects that usually do not require medical attention (report to your doctor or health care professional if they continue or are bothersome): -dizziness -drowsiness -nausea -vomiting This list may not describe all possible side effects. Call your doctor for medical advice about side effects. You may report side effects to FDA at 1-800-FDA-1088. Where should I keep my medicine? Keep out of the reach of children. This medicine can be abused. Keep your medicine in a safe place to protect it from theft. Do not share this medicine with anyone. Selling or giving away this medicine is dangerous and against the law. Store at room temperature between 20 and 25 degrees C (68 and 77 degrees F). Keep container tightly closed. Protect from light. This medicine may cause accidental overdose and death if it is taken by other adults, children, or pets. Flush any unused medicine down the toilet to reduce the chance of harm. Do not use the medicine after the expiration date. NOTE: This sheet is a summary. It may not cover all possible information. If you have questions about this medicine, talk to your doctor, pharmacist, or health care provider.  2015, Elsevier/Gold Standard. (2012-11-13 13:17:35)  Potassium Salts tablets, extended-release tablets or capsules What is this medicine? POTASSIUM  (poe TASS i um) is a natural salt that is important for the heart, muscles, and nerves. It is found in many foods and is normally supplied by a well balanced diet. This medicine is used to treat low potassium. This medicine may be used for other purposes; ask your health care provider or pharmacist if you have questions. COMMON BRAND NAME(S): ED-K+10, Glu-K, K-10, K-8, K-Dur, K-Tab, Kaon-CL, Klor-Con, Klor-Con M10, Klor-Con M15, Klor-Con M20, Klotrix, Micro-K, Micro-K Extencaps, Slow-K What should I tell my health care provider before I take this medicine? They need to know if you have any of these conditions: -dehydration -diabetes -irregular heartbeat -kidney disease -stomach ulcers or other stomach problems -an unusual or allergic reaction to potassium salts, other medicines, foods, dyes, or preservatives -pregnant or trying to get pregnant -breast-feeding How should I use  this medicine? Take this medicine by mouth with a full glass of water. Follow the directions on the prescription label. Take with food. Do not suck on, crush, or chew this medicine. If you have difficulty swallowing, ask the pharmacist how to take. Take your medicine at regular intervals. Do not take it more often than directed. Do not stop taking except on your doctor's advice. Talk to your pediatrician regarding the use of this medicine in children. Special care may be needed. Overdosage: If you think you have taken too much of this medicine contact a poison control center or emergency room at once. NOTE: This medicine is only for you. Do not share this medicine with others. What if I miss a dose? If you miss a dose, take it as soon as you can. If it is almost time for your next dose, take only that dose. Do not take double or extra doses. What may interact with this medicine? Do not take this medicine with any of the following medications: -eplerenone -sodium polystyrene sulfonate This medicine may also interact with  the following medications: -medicines for blood pressure or heart disease like lisinopril, losartan, quinapril, valsartan -medicines for cold or allergies -medicines for inflammation like ibuprofen, indomethacin -medicines for Parkinson's disease -medicines for the stomach like metoclopramide, dicyclomine, glycopyrrolate -some diuretics This list may not describe all possible interactions. Give your health care provider a list of all the medicines, herbs, non-prescription drugs, or dietary supplements you use. Also tell them if you smoke, drink alcohol, or use illegal drugs. Some items may interact with your medicine. What should I watch for while using this medicine? Visit your doctor or health care professional for regular check ups. You will need lab work done regularly. You may need to be on a special diet while taking this medicine. Ask your doctor. What side effects may I notice from receiving this medicine? Side effects that you should report to your doctor or health care professional as soon as possible: -allergic reactions like skin rash, itching or hives, swelling of the face, lips, or tongue -black, tarry stools -heartburn -irregular heartbeat -numbness or tingling in hands or feet -pain when swallowing -unusually weak or tired Side effects that usually do not require medical attention (report to your doctor or health care professional if they continue or are bothersome): -diarrhea -nausea -stomach gas -vomiting This list may not describe all possible side effects. Call your doctor for medical advice about side effects. You may report side effects to FDA at 1-800-FDA-1088. Where should I keep my medicine? Keep out of the reach of children. Store at room temperature between 15 and 30 degrees C (59 and 86 degrees F ). Keep bottle closed tightly to protect this medicine from light and moisture. Throw away any unused medicine after the expiration date. NOTE: This sheet is a  summary. It may not cover all possible information. If you have questions about this medicine, talk to your doctor, pharmacist, or health care provider.  2015, Elsevier/Gold Standard. (2007-06-07 11:17:31)

## 2014-11-15 LAB — HIV ANTIBODY (ROUTINE TESTING W REFLEX): HIV Screen 4th Generation wRfx: NONREACTIVE

## 2014-11-15 LAB — RPR: RPR Ser Ql: NONREACTIVE

## 2014-11-15 LAB — GC/CHLAMYDIA PROBE AMP (~~LOC~~) NOT AT ARMC
Chlamydia: NEGATIVE
Neisseria Gonorrhea: POSITIVE — AB

## 2014-11-18 ENCOUNTER — Telehealth (HOSPITAL_COMMUNITY): Payer: Self-pay | Admitting: *Deleted

## 2014-11-19 ENCOUNTER — Telehealth (HOSPITAL_BASED_OUTPATIENT_CLINIC_OR_DEPARTMENT_OTHER): Payer: Self-pay | Admitting: Emergency Medicine

## 2016-07-10 IMAGING — DX DG ANKLE COMPLETE 3+V*R*
3 series · 3 of 3 positions shown · non-contrast
Comparison: None.

CLINICAL DATA: Initial evaluation for acute pain.

EXAM:
RIGHT ANKLE - COMPLETE 3+ VIEW

[ankle ap]
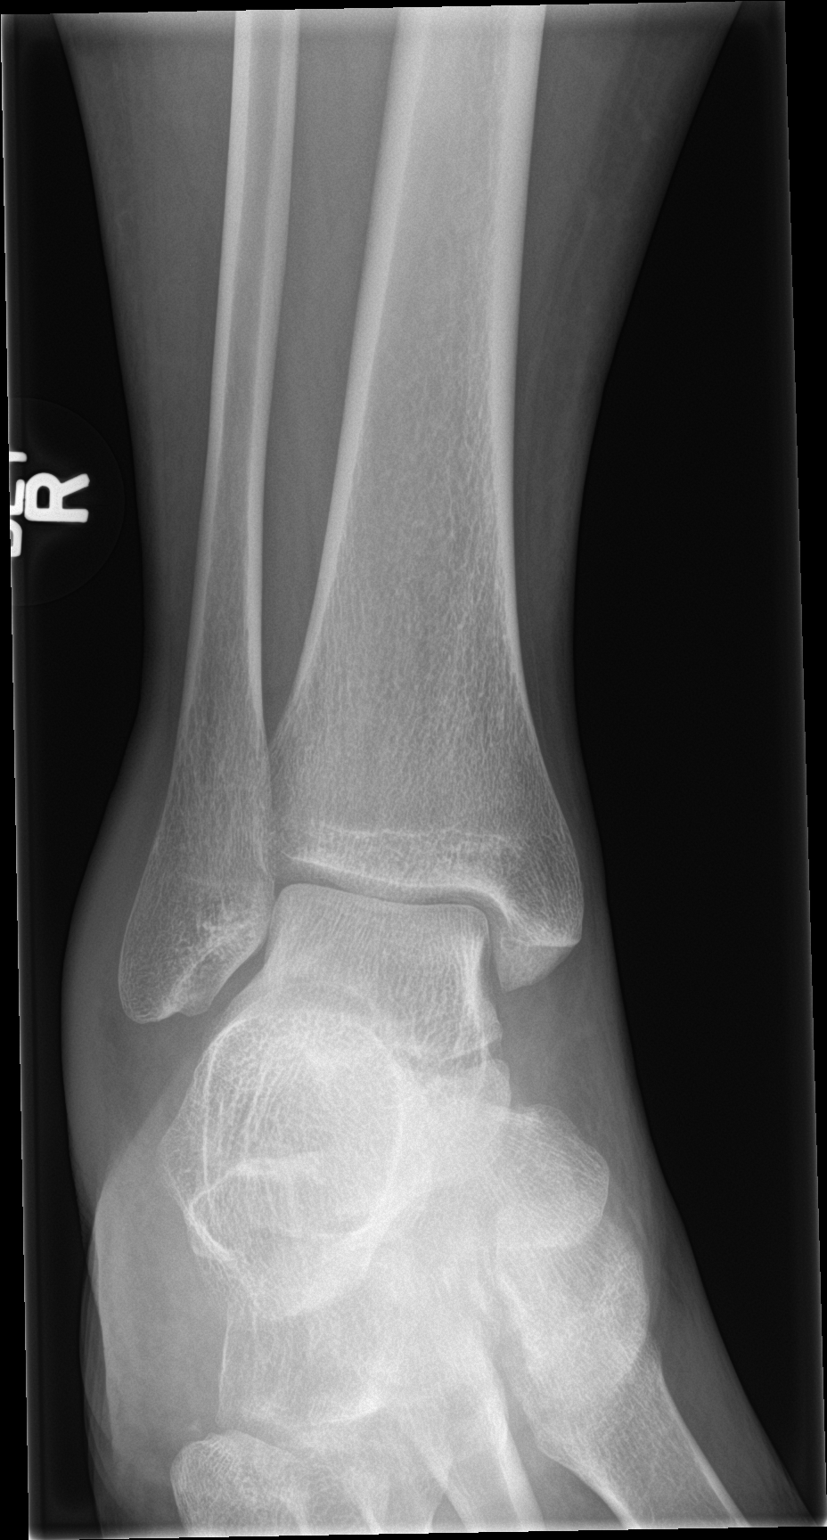

[ankle obl]
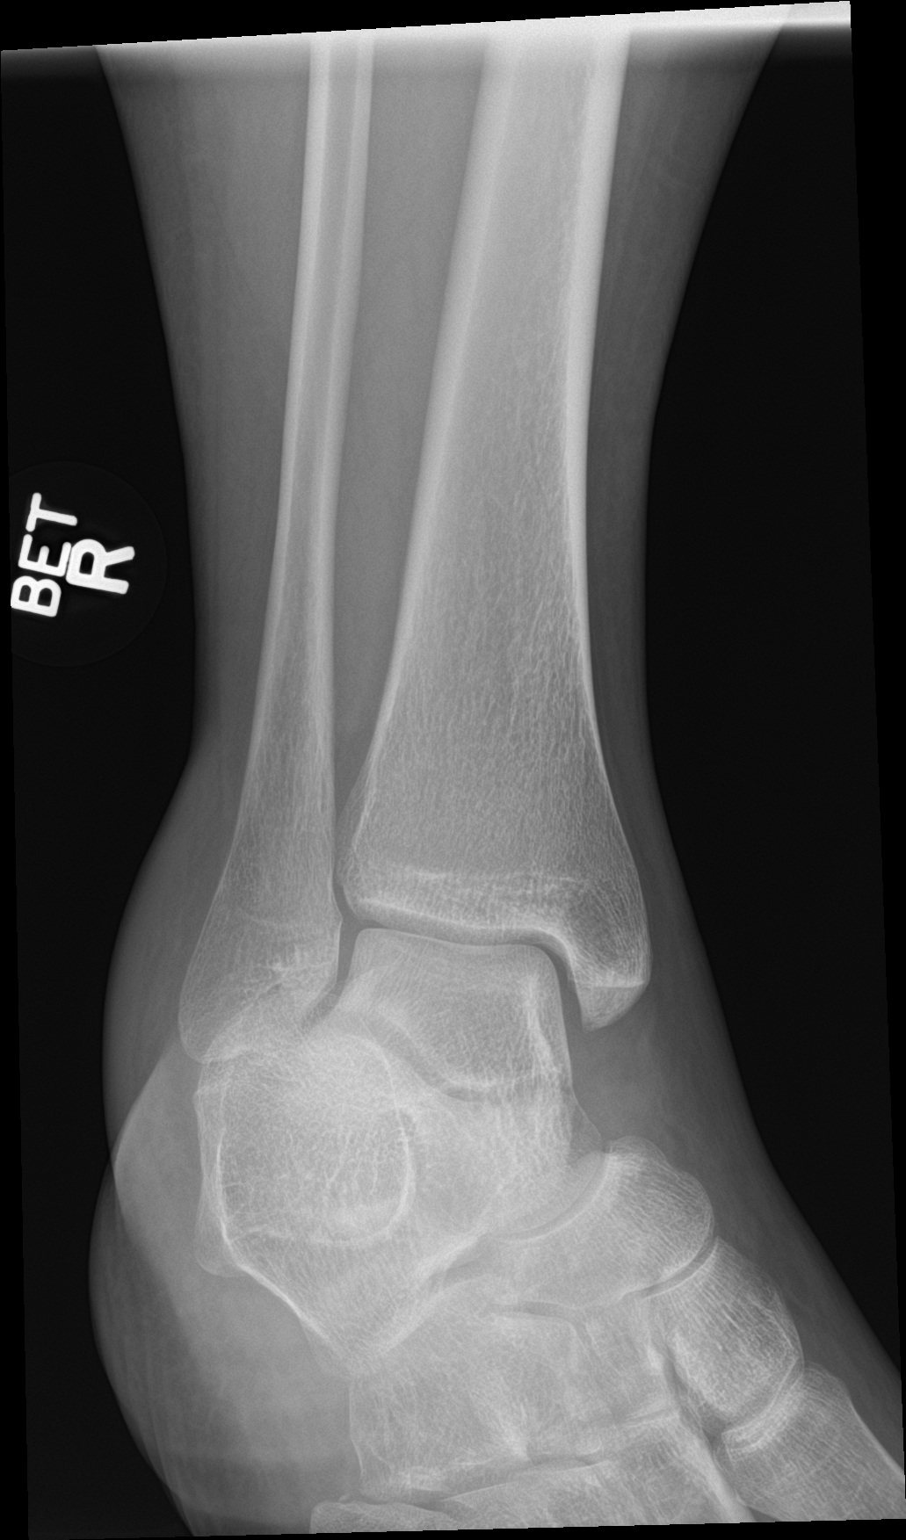

[ankle lat]
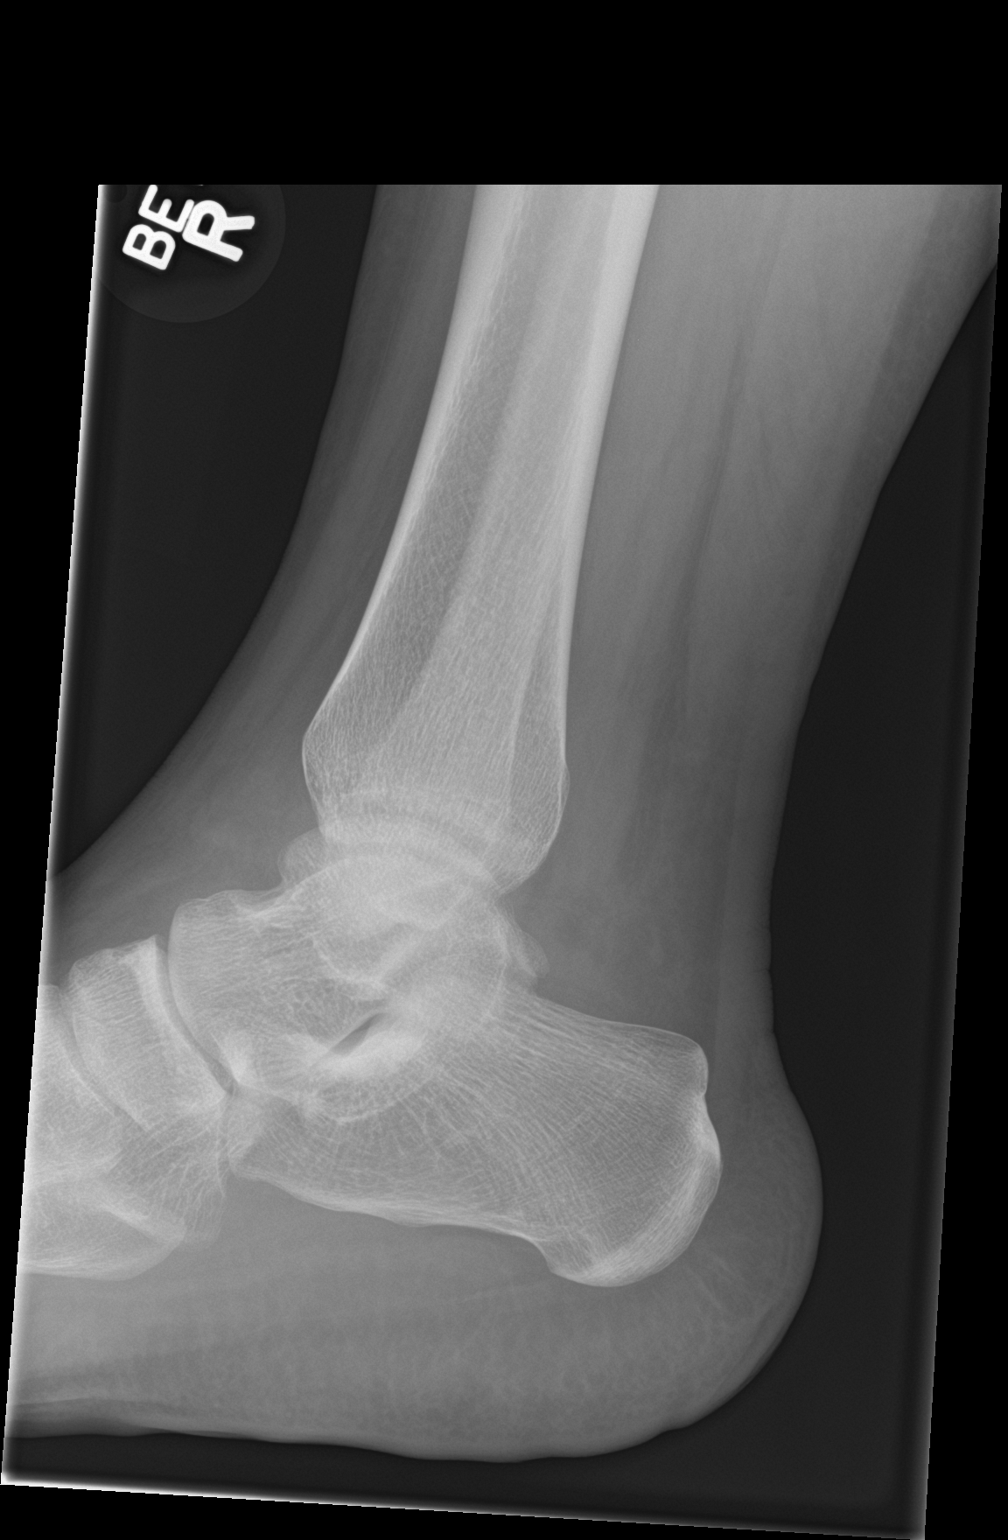

[3 of 3 positions shown; findings below may reference images not displayed]

FINDINGS: No acute fracture dislocation. Ankle mortise approximated. Talar
dome intact. Focal soft tissue swelling present at the lateral
malleolus. Osseous mineralization normal.
IMPRESSION: 1. No acute fracture or dislocation.
2. Focal soft tissue swelling at the lateral malleolus.

## 2016-08-01 IMAGING — CT CT ABD-PELV W/ CM
2 of 4 series · 16 of 46 positions shown, 18 images · IV contrast (omnipaque)
Comparison: Lumbar spine radiograph June 26, 2014

CLINICAL DATA: Intermittent sharp abdominal pain for 4 days.
Vaginal bleeding, nausea, vomiting, diarrhea, fevers and chills.
History of leukemia.

EXAM:
CT ABDOMEN AND PELVIS WITH CONTRAST
TECHNIQUE: Multidetector CT imaging of the abdomen and pelvis was performed
using the standard protocol following bolus administration of
intravenous contrast.
CONTRAST:  50mL OMNIPAQUE IOHEXOL 300 MG/ML SOLN, 100mL OMNIPAQUE
IOHEXOL 300 MG/ML SOLN

[Series 2: abd_pel_with 5.0 b40f · axial · 0.72mm/px · z∈[-509,-94]mm · 13 of 93 slices shown, 15 images]
[im 5/93  soft-tissue]
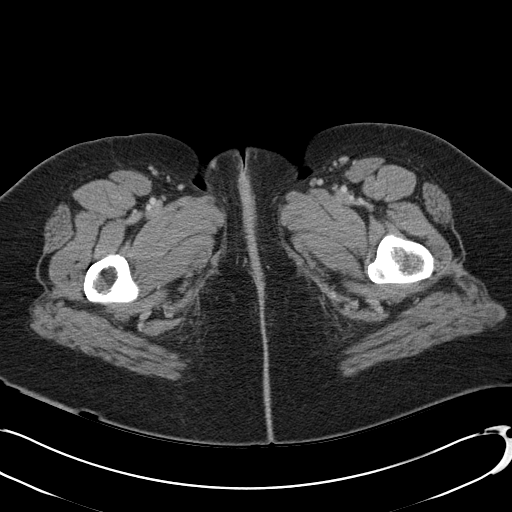
[im 5/93  bone]
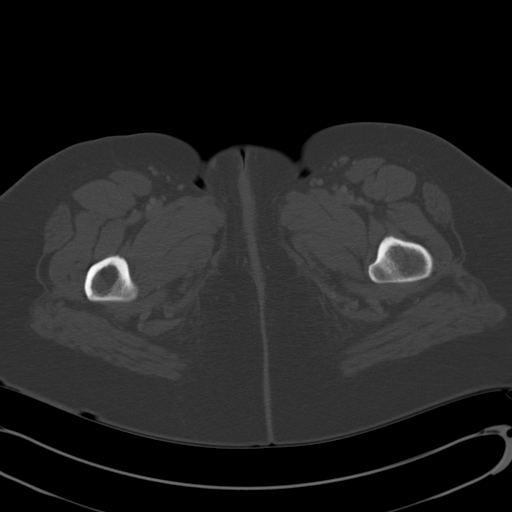
[im 13/93  soft-tissue]
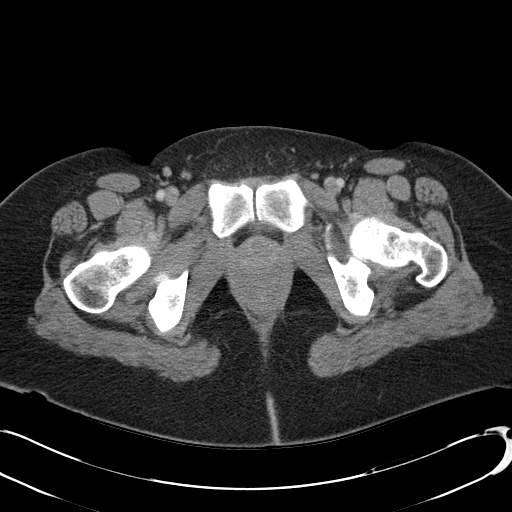
[im 21/93  soft-tissue]
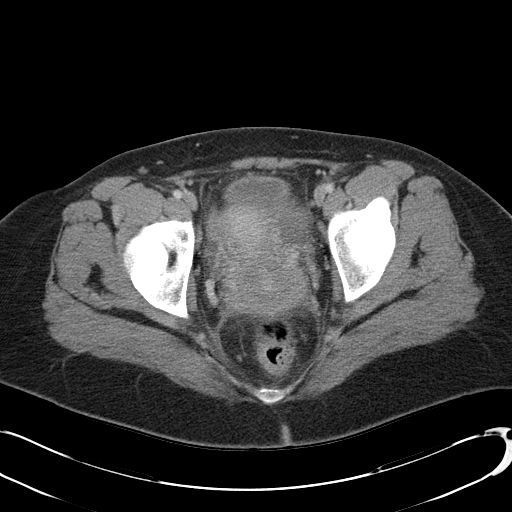
[im 26/93  soft-tissue]
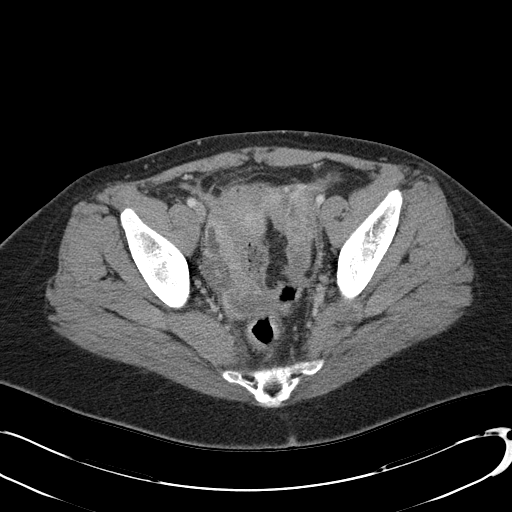
[im 34/93  soft-tissue]
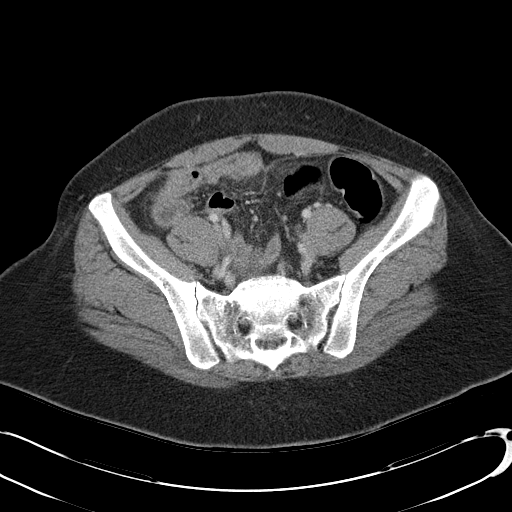
[im 38/93  soft-tissue]
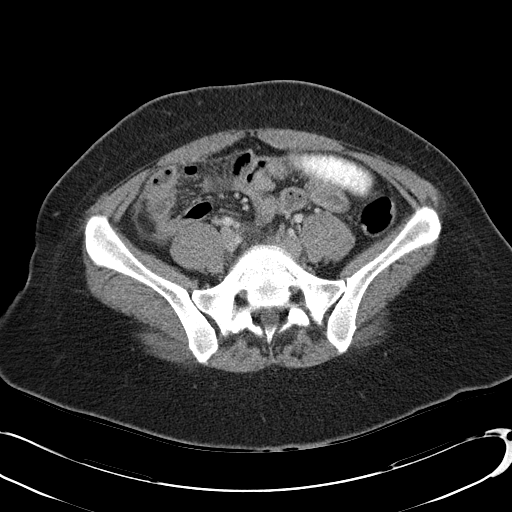
[im 47/93  soft-tissue]
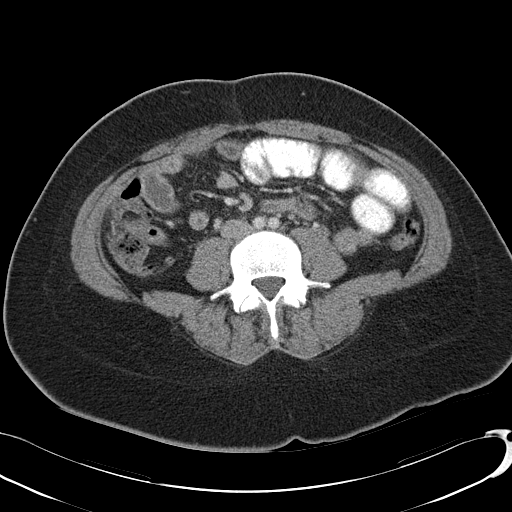
[im 55/93  soft-tissue]
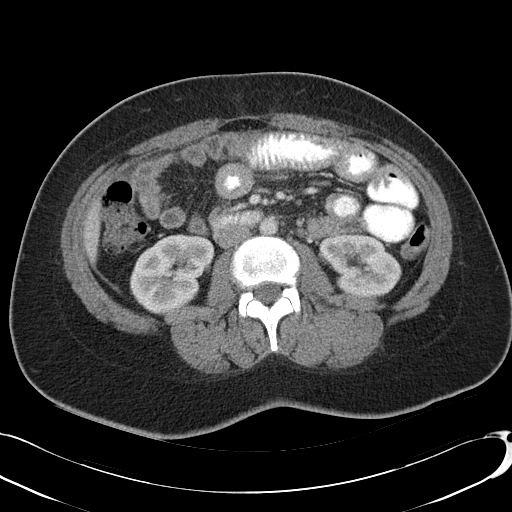
[im 59/93  soft-tissue]
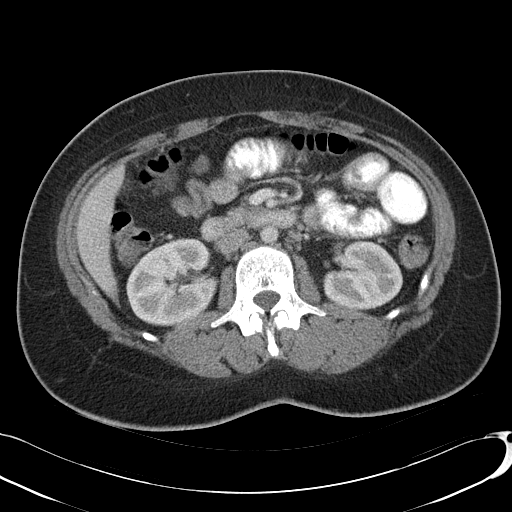
[im 59/93  bone]
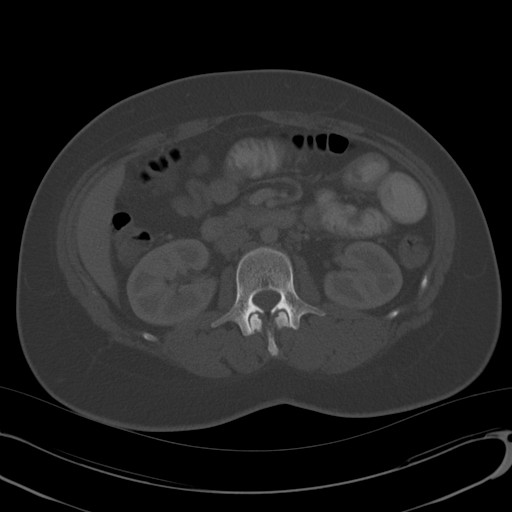
[im 67/93  soft-tissue]
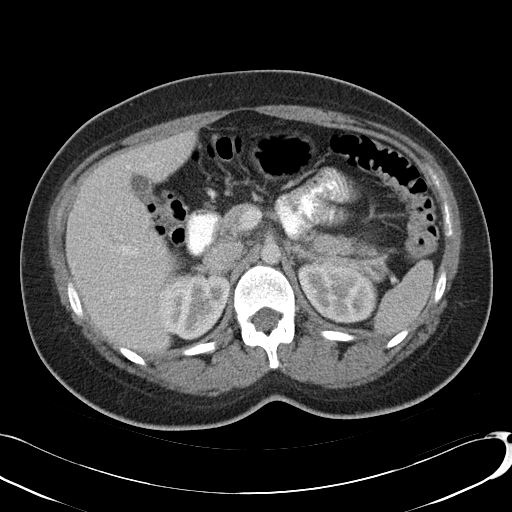
[im 72/93  soft-tissue]
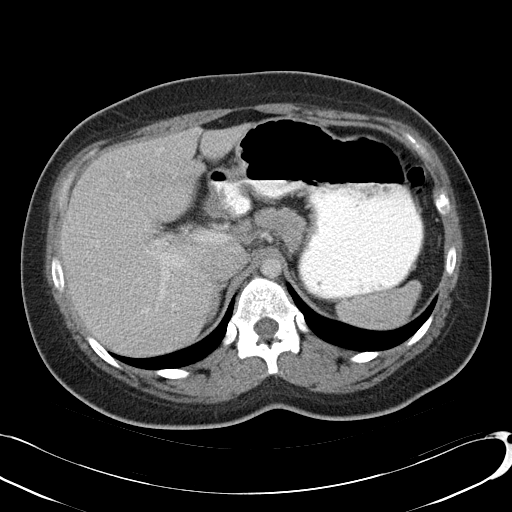
[im 80/93  soft-tissue]
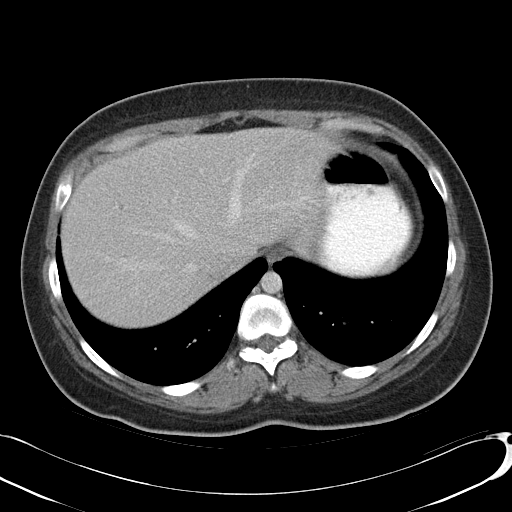
[im 88/93  soft-tissue]
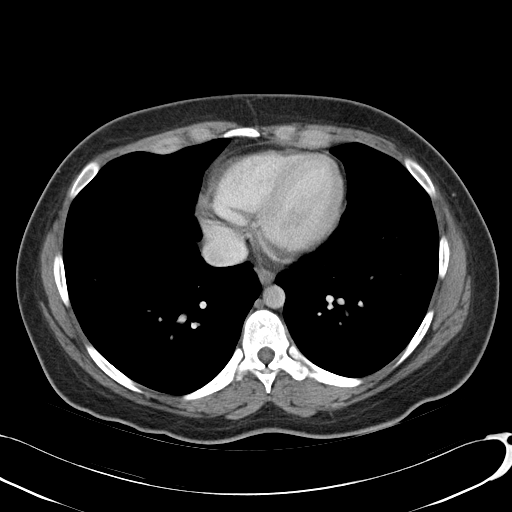

[Series 3: abd_pel_with 3.0 spo · coronal · 0.83mm/px · 3 of 85 slices shown]
[im 29/85  soft-tissue]
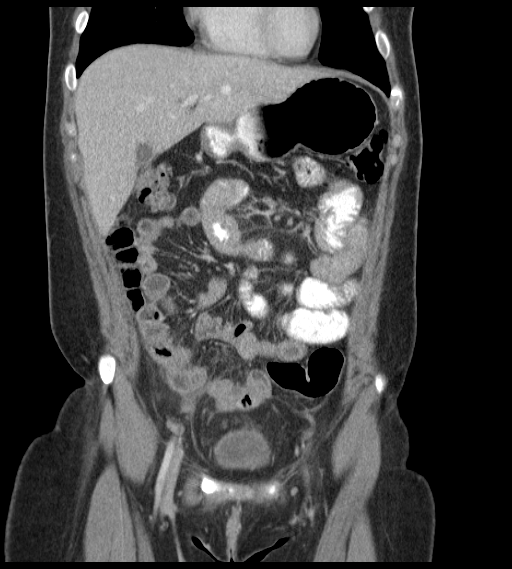
[im 38/85  soft-tissue]
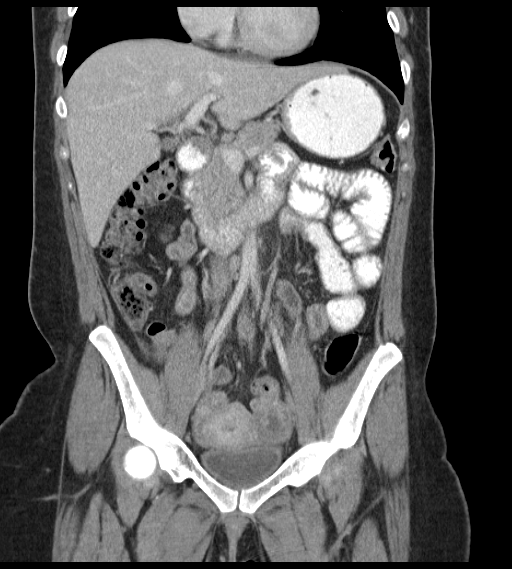
[im 47/85  soft-tissue]
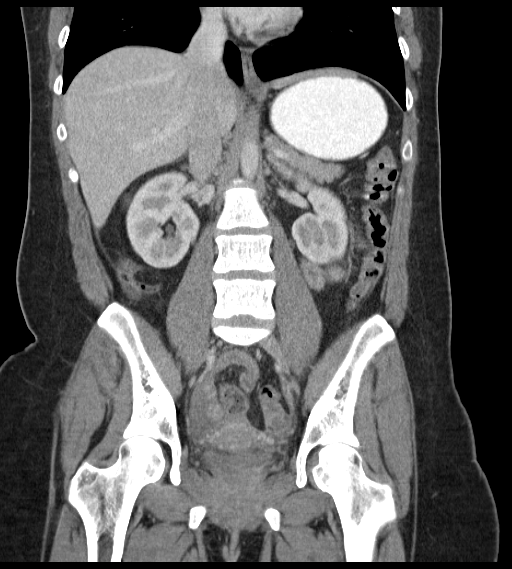

[16 of 46 positions shown; findings below may reference images not displayed]

FINDINGS: LUNG BASES: Included view of the lung bases are clear. Visualized
heart and pericardium are unremarkable.

SOLID ORGANS: The liver, spleen, gallbladder, pancreas and adrenal
glands are unremarkable.

GASTROINTESTINAL TRACT: Multiple loops of mildly thickened,
edematous small bowel LEFT hyperemia. Enteric contrast has not yet
reached the distal small bowel. The stomach, large bowel are normal
in course and caliber without inflammatory changes. Normal appendix.

KIDNEYS/ URINARY TRACT: Kidneys are orthotopic, demonstrating
symmetric enhancement. No nephrolithiasis, hydronephrosis or solid
renal masses. The unopacified ureters are normal in course and
caliber. Urinary bladder is partially distended and unremarkable.

PERITONEUM/RETROPERITONEUM: Aortoiliac vessels are normal in course
and caliber. No lymphadenopathy by CT size criteria. Heterogeneously
enhancing LEFT ovary. Small tubular cystic structure RIGHT pelvis
(coronal 45/88). Small amount of subhepatic ascites, free fluid in
the mesenteric with edema and and, small amount of free fluid in the
pelvis .

SOFT TISSUE/OSSEOUS STRUCTURES: Non-suspicious. Moderate RIGHT
sacroiliac osteoarthrosis.
IMPRESSION: Small bowel wall thickening and inflammation consistent with
enteritis which may be infectious or inflammatory. Small amount of
ascites. No bowel obstruction.

Small tubular fluid-filled structure in pelvis, in addition to
heterogeneously enhancing LEFT adnexae, which can be seen with
pelvic inflammatory disease, possible RIGHT hydro/pyosalpinx.

## 2017-04-09 ENCOUNTER — Emergency Department (HOSPITAL_COMMUNITY)
Admission: EM | Admit: 2017-04-09 | Discharge: 2017-04-09 | Disposition: A | Discharge status: Home / Self Care | Payer: Self-pay | Attending: Emergency Medicine | Admitting: Emergency Medicine

## 2017-04-09 ENCOUNTER — Other Ambulatory Visit: Payer: Self-pay

## 2017-04-09 ENCOUNTER — Encounter (HOSPITAL_COMMUNITY): Payer: Self-pay

## 2017-04-09 DIAGNOSIS — F17210 Nicotine dependence, cigarettes, uncomplicated: Secondary | ICD-10-CM | POA: Insufficient documentation

## 2017-04-09 DIAGNOSIS — N764 Abscess of vulva: Secondary | ICD-10-CM | POA: Insufficient documentation

## 2017-04-09 MED ORDER — DOXYCYCLINE HYCLATE 100 MG PO TABS
100.0000 mg | ORAL_TABLET | Freq: Once | ORAL | Status: AC
  Administered 2017-04-09: 100 mg via ORAL
  Filled 2017-04-09: qty 1

## 2017-04-09 MED ORDER — ONDANSETRON HCL 4 MG PO TABS
4.0000 mg | ORAL_TABLET | Freq: Once | ORAL | Status: AC
  Administered 2017-04-09: 4 mg via ORAL
  Filled 2017-04-09: qty 1

## 2017-04-09 MED ORDER — IBUPROFEN 800 MG PO TABS
800.0000 mg | ORAL_TABLET | Freq: Once | ORAL | Status: AC
  Administered 2017-04-09: 800 mg via ORAL
  Filled 2017-04-09: qty 1

## 2017-04-09 MED ORDER — IBUPROFEN 600 MG PO TABS: 600.0000 mg | ORAL_TABLET | Freq: Four times a day (QID) | ORAL | 0 refills | Status: DC

## 2017-04-09 MED ORDER — SULFAMETHOXAZOLE-TRIMETHOPRIM 800-160 MG PO TABS: 1.0000 | ORAL_TABLET | Freq: Two times a day (BID) | ORAL | 0 refills | Status: AC

## 2017-04-09 MED ORDER — TRAMADOL HCL 50 MG PO TABS
100.0000 mg | ORAL_TABLET | Freq: Once | ORAL | Status: DC
  Filled 2017-04-09: qty 2

## 2017-04-09 MED ORDER — TRAMADOL HCL 50 MG PO TABS: 50.0000 mg | ORAL_TABLET | Freq: Four times a day (QID) | ORAL | 0 refills | Status: DC | PRN

## 2017-04-09 NOTE — ED Triage Notes (Signed)
Reports of shaving vaginal area x2 days ago and noticed 2 bumps after shaving. Reports of swelling to right side of vagina.

## 2017-04-09 NOTE — ED Provider Notes (Signed)
[facility] Provider Note   CSN: [id number] Arrival date & time: 04/09/17  0848     History   Chief Complaint Chief Complaint  Patient presents with  . Abscess    HPI KALEYAH LABRECK is a 27 y.o. female.  Patient is a 27 year old female who presents to the emergency department with complaint of abscess of the labia.  The patient states that she has been shaving in the pubis area.  She last shaved about 4 days ago.  She noticed a couple of bumps on her right labia.  She then noticed a firm hard area that is tender to touch.  She now has pain when she walks or when she touches the labial area.  There is no drainage noted.  There is no involvement of the left labia.  There is no vaginal discharge appreciated.  And is been no fever reported.  The patient has tried warm compresses but states this is not helping any.      Past Medical History:  Diagnosis Date  . HSV-2 (herpes simplex virus 2) infection   . Leukemia ([place])   . Pregnancy as incidental finding     Patient Active Problem List   Diagnosis Date Noted  . HSV-2 (herpes simplex virus 2) infection     Past Surgical History:  Procedure Laterality Date  . LEUKEMIA    . NO PAST SURGERIES      OB History    Gravida Para Term Preterm AB Living   1 1 1  0 0 1   SAB TAB Ectopic Multiple Live Births   0 0 0 0 1       Home Medications    Prior to Admission medications   Medication Sig Start Date End Date Taking? Authorizing Provider  doxycycline (VIBRAMYCIN) 100 MG capsule Take 1 capsule (100 mg total) by mouth 2 (two) times daily. One po bid x 7 days 8/33/82   Delora Fuel, MD  famotidine (PEPCID) 20 MG tablet Take 20 mg by mouth 2 (two) times daily. As needed for acid reflux    [provider]  metroNIDAZOLE (FLAGYL) 500 MG tablet Take 1 tablet (500 mg total) by mouth 3 (three) times daily. 08/07/37   Delora Fuel, MD  Norethindrone-Ethinyl Estradiol-Fe Biphas (LO LOESTRIN FE) 1 MG-10  MCG / 10 MCG tablet Take 1 tablet by mouth daily. 07/11/12   Florian Buff, MD  oxyCODONE-acetaminophen (PERCOCET) 5-325 MG per tablet Take 1 tablet by mouth every 4 (four) hours as needed for moderate pain. 7/67/34   Delora Fuel, MD  potassium chloride SA (K-DUR,KLOR-CON) 20 MEQ tablet Take 1 tablet (20 mEq total) by mouth 2 (two) times daily. 1/93/79   Delora Fuel, MD  Prenatal Vit-Fe Fumarate-FA (PRENATAL MULTIVITAMIN) TABS Take 1 tablet by mouth daily.    [provider]    Family History Family History  Problem Relation Age of Onset  . Hypertension Mother   . Diabetes Other     Social History Social History   Tobacco Use  . Smoking status: Current Some Day Smoker    Types: Cigarettes  . Smokeless tobacco: Never Used  Substance Use Topics  . Alcohol use: Yes    Comment: has a beer every now and then.  . Drug use: Yes    Types: Marijuana    Comment: Patient YDS positive for marijuana. pt states not now.     Allergies   Amphotericin b   Review of Systems Review of Systems  Constitutional: Negative for activity change.       All ROS Neg except as noted in HPI  HENT: Negative for nosebleeds.   Eyes: Negative for photophobia and discharge.  Respiratory: Negative for cough, shortness of breath and wheezing.   Cardiovascular: Negative for chest pain and palpitations.  Gastrointestinal: Negative for abdominal pain and blood in stool.  Genitourinary: Positive for vaginal pain. Negative for dysuria, frequency and hematuria.  Musculoskeletal: Negative for arthralgias, back pain and neck pain.  Skin: Negative.   Neurological: Negative for dizziness, seizures and speech difficulty.  Psychiatric/Behavioral: Negative for confusion and hallucinations.     Physical Exam Updated Vital Signs BP 120/78 (BP Location: Right Arm)   Pulse 90   Temp 98.5 F (36.9 C) (Oral)   Resp 18   Ht 5\' 5"  (1.651 m)   Wt 81.6 kg (180 lb)   LMP 03/23/2017   SpO2 100%   BMI 29.95  kg/m   Physical Exam  Constitutional: She is oriented to person, place, and time. She appears well-developed and well-nourished.  Non-toxic appearance.  HENT:  Head: Normocephalic.  Right Ear: Tympanic membrane and external ear normal.  Left Ear: Tympanic membrane and external ear normal.  Eyes: EOM and lids are normal. Pupils are equal, round, and reactive to light.  Neck: Normal range of motion. Neck supple. Carotid bruit is not present.  Cardiovascular: Normal rate, regular rhythm, normal heart sounds, intact distal pulses and normal pulses.  Pulmonary/Chest: Breath sounds normal. No respiratory distress.  Abdominal: Soft. Bowel sounds are normal. There is no tenderness. There is no guarding.  Genitourinary:  Genitourinary Comments: Chaperone present during exam. Right external labia swollen. Tender to palpation . No drainage present. No red streaks. Few palpable nodes of the inguinal area. No vaginal discharge or bleeding.  Musculoskeletal: Normal range of motion.  Lymphadenopathy:       Head (right side): No submandibular adenopathy present.       Head (left side): No submandibular adenopathy present.    She has no cervical adenopathy.  Neurological: She is alert and oriented to person, place, and time. She has normal strength. No cranial nerve deficit or sensory deficit.  Skin: Skin is warm and dry.  Psychiatric: She has a normal mood and affect. Her speech is normal.  Nursing note and vitals reviewed.    ED Treatments / Results  Labs (all labs ordered are listed, but only abnormal results are displayed) Labs Reviewed - No data to display  EKG  EKG Interpretation None       Radiology No results found.  Procedures Procedures (including critical care time)  Medications Ordered in ED Medications - No data to display   Initial Impression / Assessment and Plan / ED Course  I have reviewed the triage vital signs and the nursing notes.  Pertinent labs & imaging  results that were available during my care of the patient were reviewed by me and considered in my medical decision making (see chart for details).       Final Clinical Impressions(s) / ED Diagnoses MDM Vital signs within normal limits.  Pulse oximetry is 100% on room air.  Within normal limits by my interpretation.  The patient has an abscess of the right upper labia extending to beyond the mid labia.  What else there is no drainage at this time and no red streaking appreciated.  Vital signs are within normal limits.  I have asked the patient to do warm tub soaks.  We will  use Bactrim, ibuprofen, and Ultram at this time.  I have asked the patient to return to the emergency department or see her GYN physician if this is not improving.  I also asked her to return if any signs of advancing infection including fever, chills, drainage, red streaking, etc.  Patient is in agreement with this plan.   Final diagnoses:  Abscess of right genital labia    ED Discharge Orders        Ordered    sulfamethoxazole-trimethoprim (BACTRIM DS,SEPTRA DS) 800-160 MG tablet  2 times daily     04/09/17 1045    traMADol (ULTRAM) 50 MG tablet  Every 6 hours PRN     04/09/17 1045    ibuprofen (ADVIL,MOTRIN) 600 MG tablet  4 times daily     04/09/17 [place], [place] Lebeau, PA-C 04/09/17 1101    Nat Christen, MD 04/10/17 2224

## 2017-04-09 NOTE — Discharge Instructions (Signed)
Please soak in a tub of warm [place] salt water for about 15 minutes daily until the abscess resolves.  Please use Bactrim 2 times daily with a meal.  Use ibuprofen with breakfast, lunch, dinner, and at bedtime.  May use Ultram for more severe pain.  Ultram may cause drowsiness.  Please do not drive, drink alcohol, operate machinery, or participate in activities requiring concentration when taking this medication.  Please see your GYN physician or return to the emergency department if the abscess area is not resolving.

## 2017-04-09 NOTE — ED Triage Notes (Signed)
Called for pt x 1.  

## 2020-07-10 ENCOUNTER — Other Ambulatory Visit: Payer: Self-pay | Admitting: Advanced Practice Midwife

## 2020-07-23 ENCOUNTER — Other Ambulatory Visit: Payer: Self-pay | Admitting: Advanced Practice Midwife

## 2020-08-21 ENCOUNTER — Other Ambulatory Visit: Payer: Self-pay | Admitting: Advanced Practice Midwife

## 2020-09-17 ENCOUNTER — Encounter: Payer: Self-pay | Admitting: Nurse Practitioner

## 2020-09-17 ENCOUNTER — Ambulatory Visit (INDEPENDENT_AMBULATORY_CARE_PROVIDER_SITE_OTHER): Payer: BC Managed Care – PPO | Admitting: Nurse Practitioner

## 2020-09-17 ENCOUNTER — Other Ambulatory Visit: Payer: Self-pay

## 2020-09-17 ENCOUNTER — Other Ambulatory Visit (HOSPITAL_COMMUNITY)
Admission: RE | Admit: 2020-09-17 | Discharge: 2020-09-17 | Disposition: A | Discharge status: Home / Self Care | Payer: BC Managed Care – PPO | Source: Ambulatory Visit | Attending: Nurse Practitioner | Admitting: Nurse Practitioner

## 2020-09-17 VITALS — BP 113/73 | HR 78 | Ht 66.0 in | Wt 191.0 lb

## 2020-09-17 DIAGNOSIS — Z6830 Body mass index (BMI) 30.0-30.9, adult: Secondary | ICD-10-CM

## 2020-09-17 DIAGNOSIS — Z01419 Encounter for gynecological examination (general) (routine) without abnormal findings: Secondary | ICD-10-CM | POA: Diagnosis not present

## 2020-09-17 DIAGNOSIS — F17200 Nicotine dependence, unspecified, uncomplicated: Secondary | ICD-10-CM | POA: Diagnosis not present

## 2020-09-17 DIAGNOSIS — Z113 Encounter for screening for infections with a predominantly sexual mode of transmission: Secondary | ICD-10-CM | POA: Diagnosis not present

## 2020-09-17 DIAGNOSIS — F419 Anxiety disorder, unspecified: Secondary | ICD-10-CM

## 2020-09-17 NOTE — Progress Notes (Signed)
GYNECOLOGY ANNUAL PREVENTATIVE CARE ENCOUNTER NOTE  Subjective:   Jennifer Krause is a 30 y.o. G68P1001 female here for a routine annual gynecologic exam.  Current complaints: has not been seen in the office in 9 years.   Denies abnormal vaginal bleeding, discharge, pelvic pain, problems with intercourse or other gynecologic concerns.    Gynecologic History Patient's last menstrual period was 09/01/2020 (within days). Contraception: condoms Last Pap: 2014. Results were: normal   Obstetric History OB History  Gravida Para Term Preterm AB Living  1 1 1  0 0 1  SAB IAB Ectopic Multiple Live Births  0 0 0 0 1    # Outcome Date GA Lbr Len/2nd Weight Sex Delivery Anes PTL Lv  1 Term 08/26/11 [redacted]w[redacted]d 07:31 / 00:11 6 lb 0.7 oz (2.74 kg) F Vag-Spont EPI  LIV    Past Medical History:  Diagnosis Date   HSV-2 (herpes simplex virus 2) infection    Leukemia ([place])    Pregnancy as incidental finding     Past Surgical History:  Procedure Laterality Date   LEUKEMIA     NO PAST SURGERIES      No current outpatient medications on file prior to visit.   No current facility-administered medications on file prior to visit.    Allergies  Allergen Reactions   Amphotericin B Other (See Comments)    seizures   Penicillins     Social History   Socioeconomic History   Marital status: Single    Spouse name: Not on file   Number of children: Not on file   Years of education: Not on file   Highest education level: Not on file  Occupational History   Not on file  Tobacco Use   Smoking status: Some Days    Pack years: 0.00    Types: Cigarettes   Smokeless tobacco: Never  Vaping Use   Vaping Use: Never used  Substance and Sexual Activity   Alcohol use: Yes    Comment: has a beer every now and then.   Drug use: Yes    Types: Marijuana    Comment: Patient YDS positive for marijuana. pt states not now.   Sexual activity: Yes    Birth control/protection: Condom, None  Other Topics  Concern   Not on file  Social History Narrative   Not on file   Social Determinants of Health   Financial Resource Strain: Low Risk    Difficulty of Paying Living Expenses: Not hard at all  Food Insecurity: Food Insecurity Present   Worried About Charity fundraiser in the Last Year: Sometimes true   Arboriculturist in the Last Year: Never true  Transportation Needs: No Transportation Needs   Lack of Transportation (Medical): No   Lack of Transportation (Non-Medical): No  Physical Activity: Sufficiently Active   Days of Exercise per Week: 5 days   Minutes of Exercise per Session: 100 min  Stress: Stress Concern Present   Feeling of Stress : Very much  Social Connections: Moderately Isolated   Frequency of Communication with Friends and Family: Once a week   Frequency of Social Gatherings with Friends and Family: Once a week   Attends Religious Services: 1 to 4 times per year   Active Member of [employer] or Organizations: Yes   Attends Archivist Meetings: Never   Marital Status: Never married  Human resources officer Violence: Not At Risk   Fear of Current or Ex-Partner: No   Emotionally  Abused: No   Physically Abused: No   Sexually Abused: No    Family History  Problem Relation Age of Onset   Hypertension Mother    Diabetes Other     The following portions of the patient's history were reviewed and updated as appropriate: allergies, current medications, past family history, past medical history, past social history, past surgical history and problem list.  Review of Systems Pertinent items noted in HPI and remainder of comprehensive ROS otherwise negative.   Objective:  BP 113/73 (BP Location: Right Arm, Patient Position: Sitting, Cuff Size: Normal)   Pulse 78   Ht 5\' 6"  (1.676 m)   Wt 191 lb (86.6 kg)   LMP 09/01/2020 (Within Days)   BMI 30.83 kg/m  CONSTITUTIONAL: Well-developed, well-nourished female in no acute distress.  HENT:  Normocephalic, atraumatic,  External right and left ear normal.  EYES: Conjunctivae and EOM are normal. Pupils are equal, round.  No scleral icterus.  NECK: Normal range of motion, supple, no masses.  Normal thyroid.  SKIN: Skin is warm and dry. No rash noted. Not diaphoretic. No erythema. No pallor. NEUROLOGIC: Alert and oriented to person, place, and time. Normal reflexes, muscle tone coordination. No cranial nerve deficit noted. PSYCHIATRIC: Normal mood and affect. Normal behavior. Normal judgment and thought content. CARDIOVASCULAR: Normal heart rate noted, regular rhythm RESPIRATORY: Clear to auscultation bilaterally. Effort and breath sounds normal, no problems with respiration noted. BREASTS: Symmetric in size. No masses, skin changes, nipple drainage, or lymphadenopathy. ABDOMEN: Soft, no distention noted.  No tenderness, rebound or guarding.  PELVIC: Normal appearing external genitalia; normal appearing vaginal mucosa and cervix.  No abnormal discharge noted.  Pap smear obtained.  Normal uterine size, no other palpable masses, no uterine or adnexal tenderness. MUSCULOSKELETAL: Normal range of motion. No tenderness.  No cyanosis, clubbing, or edema.    Assessment and Plan:  1. Encounter for gynecological examination with Papanicolaou smear of cervix Using Condoms with intercourse No exam in 9 years since baby was born due to no insurance Advised  to sign up for MyChart  - Cytology - PAP( [place])  2. Screen for STD (sexually transmitted disease)  - Cytology - PAP( [place])  3. BMI 30.0-30.9,adult Weight loss with healthy eating and increasing physical exercis  4. Current smoker Advised to stop all smoking - smokes black and milds Reviewed relationship between smoking and COPD and the need for continuous oxygen later in life.  5. Anxiety Having trouble sleeping at night Now has insurance through her work Will call her insurance and check about mental health or behavorial health benefits If she  has benefits will call the office and we can help her arrange an appointment for behavioral health  Will follow up results of pap smear and manage accordingly. Routine preventative health maintenance measures emphasized. Please refer to After Visit Summary for other counseling recommendations.    Earlie Server, RN, MSN, NP-BC Nurse Practitioner, [place] for [facility]

## 2020-09-19 LAB — CYTOLOGY - PAP
Chlamydia: NEGATIVE
Comment: NEGATIVE
Comment: NEGATIVE
Comment: NORMAL
Diagnosis: NEGATIVE
High risk HPV: NEGATIVE
Neisseria Gonorrhea: NEGATIVE

## 2022-06-01 ENCOUNTER — Other Ambulatory Visit: Payer: Self-pay

## 2022-06-01 ENCOUNTER — Emergency Department (HOSPITAL_COMMUNITY)
Admission: EM | Admit: 2022-06-01 | Discharge: 2022-06-01 | Disposition: A | Discharge status: Home / Self Care | Payer: BC Managed Care – PPO | Attending: Emergency Medicine | Admitting: Emergency Medicine

## 2022-06-01 ENCOUNTER — Encounter (HOSPITAL_COMMUNITY): Payer: Self-pay | Admitting: Emergency Medicine

## 2022-06-01 DIAGNOSIS — Z856 Personal history of leukemia: Secondary | ICD-10-CM | POA: Insufficient documentation

## 2022-06-01 DIAGNOSIS — R197 Diarrhea, unspecified: Secondary | ICD-10-CM | POA: Diagnosis not present

## 2022-06-01 DIAGNOSIS — F17210 Nicotine dependence, cigarettes, uncomplicated: Secondary | ICD-10-CM | POA: Insufficient documentation

## 2022-06-01 DIAGNOSIS — R112 Nausea with vomiting, unspecified: Secondary | ICD-10-CM

## 2022-06-01 LAB — COMPREHENSIVE METABOLIC PANEL
ALT: 21 U/L (ref 0–44)
AST: 23 U/L (ref 15–41)
Albumin: 3.6 g/dL (ref 3.5–5.0)
Alkaline Phosphatase: 45 U/L (ref 38–126)
Anion gap: 9 (ref 5–15)
BUN: 10 mg/dL (ref 6–20)
CO2: 27 mmol/L (ref 22–32)
Calcium: 8.2 mg/dL — ABNORMAL LOW (ref 8.9–10.3)
Chloride: 99 mmol/L (ref 98–111)
Creatinine, Ser: 0.53 mg/dL (ref 0.44–1.00)
GFR, Estimated: >60 mL/min (ref >60)
Glucose, Bld: 94 mg/dL (ref 70–99)
Potassium: 3.5 mmol/L (ref 3.5–5.1)
Sodium: 135 mmol/L (ref 135–145)
Total Bilirubin: 0.3 mg/dL (ref 0.3–1.2)
Total Protein: 6.9 g/dL (ref 6.5–8.1)

## 2022-06-01 LAB — CBC
HCT: 39.2 % (ref 36.0–46.0)
Hemoglobin: 13.4 g/dL (ref 12.0–15.0)
MCH: 29.5 pg (ref 26.0–34.0)
MCHC: 34.2 g/dL (ref 30.0–36.0)
MCV: 86.3 fL (ref 80.0–100.0)
Platelets: 320 10*3/uL (ref 150–400)
RBC: 4.54 MIL/uL (ref 3.87–5.11)
RDW: 13.8 % (ref 11.5–15.5)
WBC: 8.2 10*3/uL (ref 4.0–10.5)
nRBC: 0 % (ref 0.0–0.2)

## 2022-06-01 LAB — POC URINE PREG, ED: Preg Test, Ur: NEGATIVE

## 2022-06-01 LAB — LIPASE, BLOOD: Lipase: 21 U/L (ref 11–51)

## 2022-06-01 MED ORDER — SODIUM CHLORIDE 0.9 % IV BOLUS
1000.0000 mL | Freq: Once | INTRAVENOUS | Status: AC
  Administered 2022-06-01: 1000 mL via INTRAVENOUS

## 2022-06-01 MED ORDER — ONDANSETRON HCL 4 MG/2ML IJ SOLN
4.0000 mg | Freq: Once | INTRAMUSCULAR | Status: AC
  Administered 2022-06-01: 4 mg via INTRAVENOUS
  Filled 2022-06-01: qty 2

## 2022-06-01 MED ORDER — LOPERAMIDE HCL 2 MG PO CAPS: 2.0000 mg | ORAL_CAPSULE | Freq: Four times a day (QID) | ORAL | 0 refills | Status: AC | PRN

## 2022-06-01 MED ORDER — ONDANSETRON 4 MG PO TBDP: 4.0000 mg | ORAL_TABLET | Freq: Three times a day (TID) | ORAL | 0 refills | Status: AC | PRN

## 2022-06-01 NOTE — Discharge Instructions (Signed)
You were evaluated in the Emergency Department and after careful evaluation, we did not find any emergent condition requiring admission or further testing in the hospital.  Your exam/testing today is overall reassuring.  Symptoms likely due to a stomach bug or viral illness.  Use the Zofran as needed for nausea, use the Imodium as needed for diarrhea.  Plenty of fluids and rest.  Please return to the Emergency Department if you experience any worsening of your condition.   Thank you for allowing [place] to be a part of your care.

## 2022-06-01 NOTE — ED Triage Notes (Signed)
Pt with c/o N/V/D x 1 day with chills.

## 2022-06-01 NOTE — ED Provider Notes (Signed)
[facility] Emergency Department Provider Note MRN:  [id number]  Arrival date & time: 06/01/22     Chief Complaint   N/V/D   History of Present Illness   Jennifer Krause is a 32 y.o. year-old female with a history of leukemia presenting to the ED with chief complaint of nausea vomiting diarrhea.  Nausea vomiting and diarrhea for the past 2 or 3 days.  Starting to feel dehydrated.  Denies abdominal pain.  Unsure if she has been having fevers.  Review of Systems  A thorough review of systems was obtained and all systems are negative except as noted in the HPI and PMH.   Patient's Health History    Past Medical History:  Diagnosis Date   HSV-2 (herpes simplex virus 2) infection    Leukemia ([facility])    Pregnancy as incidental finding     Past Surgical History:  Procedure Laterality Date   LEUKEMIA     NO PAST SURGERIES      Family History  Problem Relation Age of Onset   Hypertension Mother    Diabetes Other     Social History   Socioeconomic History   Marital status: Single    Spouse name: Not on file   Number of children: Not on file   Years of education: Not on file   Highest education level: Not on file  Occupational History   Not on file  Tobacco Use   Smoking status: Some Days    Types: Cigarettes   Smokeless tobacco: Never  Vaping Use   Vaping Use: Never used  Substance and Sexual Activity   Alcohol use: Yes    Comment: has a beer every now and then.   Drug use: Yes    Types: Marijuana    Comment: Patient YDS positive for marijuana. pt states not now.   Sexual activity: Yes    Birth control/protection: Condom, None  Other Topics Concern   Not on file  Social History Narrative   Not on file   Social Determinants of Health   Financial Resource Strain: Low Risk  (09/17/2020)   Overall Financial Resource Strain (CARDIA)    Difficulty of Paying Living Expenses: Not hard at all  Food Insecurity: Food Insecurity Present  (09/17/2020)   Hunger Vital Sign    Worried About Running Out of Food in the Last Year: Sometimes true    Ran Out of Food in the Last Year: Never true  Transportation Needs: No Transportation Needs (09/17/2020)   PRAPARE - Hydrologist (Medical): No    Lack of Transportation (Non-Medical): No  Physical Activity: Sufficiently Active (09/17/2020)   Exercise Vital Sign    Days of Exercise per Week: 5 days    Minutes of Exercise per Session: 100 min  Stress: Stress Concern Present (09/17/2020)   [place]    Feeling of Stress : Very much  Social Connections: Moderately Isolated (09/17/2020)   Social Connection and Isolation Panel [NHANES]    Frequency of Communication with Friends and Family: Once a week    Frequency of Social Gatherings with Friends and Family: Once a week    Attends Religious Services: 1 to 4 times per year    Active Member of [employer] or Organizations: Yes    Attends Archivist Meetings: Never    Marital Status: Never married  Intimate Partner Violence: Not At Risk (09/17/2020)   Humiliation, Afraid, Rape, and  Kick questionnaire    Fear of Current or Ex-Partner: No    Emotionally Abused: No    Physically Abused: No    Sexually Abused: No     Physical Exam   Vitals:   06/01/22 0125 06/01/22 0230  BP: 114/74 117/65  Pulse: 96 69  Resp: 19 15  Temp: 98.3 F (36.8 C)   SpO2: 100% 100%    CONSTITUTIONAL: Well-appearing, NAD NEURO/PSYCH:  Alert and oriented x 3, no focal deficits EYES:  eyes equal and reactive ENT/NECK:  no LAD, no JVD CARDIO: Regular rate, well-perfused, normal S1 and S2 PULM:  CTAB no wheezing or rhonchi GI/GU:  non-distended, non-tender MSK/SPINE:  No gross deformities, no edema SKIN:  no rash, atraumatic   *Additional and/or pertinent findings included in MDM below  Diagnostic and Interventional Summary    EKG  Interpretation  Date/Time:    Ventricular Rate:    PR Interval:    QRS Duration:   QT Interval:    QTC Calculation:   R Axis:     Text Interpretation:         Labs Reviewed  COMPREHENSIVE METABOLIC PANEL - Abnormal; Notable for the following components:      Result Value   Calcium 8.2 (*)    All other components within normal limits  LIPASE, BLOOD  CBC  POC URINE PREG, ED    No orders to display    Medications  sodium chloride 0.9 % bolus 1,000 mL (0 mLs Intravenous Stopped 06/01/22 0318)  ondansetron (ZOFRAN) injection 4 mg (4 mg Intravenous Given 06/01/22 0214)  sodium chloride 0.9 % bolus 1,000 mL (1,000 mLs Intravenous New Bag/Given 06/01/22 0332)     Procedures  /  Critical Care Procedures  ED Course and Medical Decision Making  Initial Impression and Ddx Nausea vomiting and diarrhea, completely soft and nontender abdomen, no rebound guarding or rigidity.  Vital signs are normal.  Suspicious for viral illness or other benign process.  Possibly is dehydrated, electrolyte disturbance AKI considered.  Awaiting labs, providing fluids.  Past medical/surgical history that increases complexity of ED encounter: None  Interpretation of Diagnostics I personally reviewed the laboratory assessment and my interpretation is as follows: No significant blood count or electrolyte disturbance.  hCG is negative    Patient Reassessment and Ultimate Disposition/Management     Patient continues to have normal vital signs and benign abdomen on reassessment.  Appropriate for discharge.  Patient management required discussion with the following services or consulting groups:  None  Complexity of Problems Addressed Acute complicated illness or Injury  Additional Data Reviewed and Analyzed Further history obtained from: None  Additional Factors Impacting ED Encounter Risk Prescriptions  Barth Kirks. Sedonia Small, [place] [email or web address]  Final Clinical Impressions(s) / ED Diagnoses     ICD-10-CM   1. Nausea vomiting and diarrhea  R11.2    R19.7       ED Discharge Orders          Ordered    loperamide (IMODIUM) 2 MG capsule  4 times daily PRN        06/01/22 0348    ondansetron (ZOFRAN-ODT) 4 MG disintegrating tablet  Every 8 hours PRN        06/01/22 0348             Discharge Instructions Discussed with and Provided to Patient:     Discharge Instructions      You were evaluated in the  Emergency Department and after careful evaluation, we did not find any emergent condition requiring admission or further testing in the hospital.  Your exam/testing today is overall reassuring.  Symptoms likely due to a stomach bug or viral illness.  Use the Zofran as needed for nausea, use the Imodium as needed for diarrhea.  Plenty of fluids and rest.  Please return to the Emergency Department if you experience any worsening of your condition.   Thank you for allowing [place] to be a part of your care.       Maudie Flakes, MD 06/01/22 [phone]

## 2023-05-24 ENCOUNTER — Emergency Department (HOSPITAL_COMMUNITY)
Admission: EM | Admit: 2023-05-24 | Discharge: 2023-05-24 | Disposition: A | Discharge status: Home / Self Care | Payer: BC Managed Care – PPO | Attending: Student | Admitting: Student

## 2023-05-24 ENCOUNTER — Other Ambulatory Visit: Payer: Self-pay

## 2023-05-24 DIAGNOSIS — J101 Influenza due to other identified influenza virus with other respiratory manifestations: Secondary | ICD-10-CM | POA: Insufficient documentation

## 2023-05-24 DIAGNOSIS — F17210 Nicotine dependence, cigarettes, uncomplicated: Secondary | ICD-10-CM | POA: Insufficient documentation

## 2023-05-24 DIAGNOSIS — R059 Cough, unspecified: Secondary | ICD-10-CM | POA: Diagnosis present

## 2023-05-24 LAB — RESP PANEL BY RT-PCR (RSV, FLU A&B, COVID)  RVPGX2
Influenza A by PCR: POSITIVE — AB
Influenza B by PCR: NEGATIVE
Resp Syncytial Virus by PCR: NEGATIVE
SARS Coronavirus 2 by RT PCR: NEGATIVE

## 2023-05-24 MED ORDER — KETOROLAC TROMETHAMINE 15 MG/ML IJ SOLN
15.0000 mg | Freq: Once | INTRAMUSCULAR | Status: AC
  Administered 2023-05-24: 15 mg via INTRAMUSCULAR
  Filled 2023-05-24: qty 1

## 2023-05-24 MED ORDER — BENZONATATE 100 MG PO CAPS: 100.0000 mg | ORAL_CAPSULE | Freq: Three times a day (TID) | ORAL | 0 refills | Status: AC

## 2023-05-24 MED ORDER — NAPROXEN 375 MG PO TABS: 375.0000 mg | ORAL_TABLET | Freq: Two times a day (BID) | ORAL | 0 refills | Status: AC

## 2023-05-24 NOTE — ED Triage Notes (Signed)
 Pt c/o cough, body aches, chills and fever. Highest temp at home 101.0, tylenol taken around 1930.

## 2023-05-24 NOTE — ED Provider Notes (Signed)
  EMERGENCY DEPARTMENT AT [facility] Provider Note  CSN: [id number] Arrival date & time: 05/24/23 0113  Chief Complaint(s) No chief complaint on file.  HPI Jennifer Krause is a 33 y.o. female with PMH leukemia who presents emergency room for evaluation of chills and myalgias.  States that symptoms have been present for the last few days.  Has been alternating Tylenol and ibuprofen at home with minimal improvement.  Endorses associated cough but denies chest pain, shortness of breath, abdominal pain, nausea, vomiting or other systemic symptoms.   Past Medical History Past Medical History:  Diagnosis Date   HSV-2 (herpes simplex virus 2) infection    Leukemia ([facility])    Pregnancy as incidental finding    Patient Active Problem List   Diagnosis Date Noted   HSV-2 (herpes simplex virus 2) infection    Home Medication(s) Prior to Admission medications   Medication Sig Start Date End Date Taking? Authorizing Provider  benzonatate (TESSALON) 100 MG capsule Take 1 capsule (100 mg total) by mouth every 8 (eight) hours. 05/24/23  Yes Jamison Soward, MD  naproxen (NAPROSYN) 375 MG tablet Take 1 tablet (375 mg total) by mouth 2 (two) times daily. 05/24/23  Yes Mael Delap, MD  loperamide (IMODIUM) 2 MG capsule Take 1 capsule (2 mg total) by mouth 4 (four) times daily as needed for diarrhea or loose stools. 06/01/22   Sabas Sous, MD  ondansetron (ZOFRAN-ODT) 4 MG disintegrating tablet Take 1 tablet (4 mg total) by mouth every 8 (eight) hours as needed for nausea or vomiting. 06/01/22   Sabas Sous, MD                                                                                                                                    Past Surgical History Past Surgical History:  Procedure Laterality Date   LEUKEMIA     NO PAST SURGERIES     Family History Family History  Problem Relation Age of Onset   Hypertension Mother    Diabetes Other     Social  History Social History   Tobacco Use   Smoking status: Some Days    Types: Cigarettes   Smokeless tobacco: Never  Vaping Use   Vaping status: Never Used  Substance Use Topics   Alcohol use: Yes    Comment: has a beer every now and then.   Drug use: Yes    Types: Marijuana    Comment: Patient YDS positive for marijuana. pt states not now.   Allergies Amphotericin b and Penicillins  Review of Systems Review of Systems  Constitutional:  Positive for chills, fatigue and fever.  Respiratory:  Positive for cough.   Musculoskeletal:  Positive for arthralgias and myalgias.    Physical Exam Vital Signs  I have reviewed the triage vital signs BP 122/84   Pulse 98   Temp 98.5 F (36.9 C) (Oral)  Resp 19   Ht 5\' 6"  (1.676 m)   Wt 86.2 kg   LMP 05/16/2023 (Exact Date)   SpO2 99%   BMI 30.67 kg/m   Physical Exam Vitals and nursing note reviewed.  Constitutional:      General: She is not in acute distress.    Appearance: She is well-developed.  HENT:     Head: Normocephalic and atraumatic.  Eyes:     Conjunctiva/sclera: Conjunctivae normal.  Cardiovascular:     Rate and Rhythm: Normal rate and regular rhythm.     Heart sounds: No murmur heard. Pulmonary:     Effort: Pulmonary effort is normal. No respiratory distress.     Breath sounds: Normal breath sounds.  Abdominal:     Palpations: Abdomen is soft.     Tenderness: There is no abdominal tenderness.  Musculoskeletal:        General: No swelling.     Cervical back: Neck supple.  Skin:    General: Skin is warm and dry.     Capillary Refill: Capillary refill takes less than 2 seconds.  Neurological:     Mental Status: She is alert.  Psychiatric:        Mood and Affect: Mood normal.     ED Results and Treatments Labs (all labs ordered are listed, but only abnormal results are displayed) Labs Reviewed  RESP PANEL BY RT-PCR (RSV, FLU A&B, COVID)  RVPGX2 - Abnormal; Notable for the following components:       Result Value   Influenza A by PCR POSITIVE (*)    All other components within normal limits                                                                                                                          Radiology No results found.  Pertinent labs & imaging results that were available during my care of the patient were reviewed by me and considered in my medical decision making (see MDM for details).  Medications Ordered in ED Medications  ketorolac (TORADOL) 15 MG/ML injection 15 mg (15 mg Intramuscular Given 05/24/23 0408)                                                                                                                                     Procedures Procedures  (including critical care time)  Medical Decision Making / ED Course  This patient presents to the ED for concern of cough, fever, chills, this involves an extensive number of treatment options, and is a complaint that carries with it a high risk of complications and morbidity.  The differential diagnosis includes unspecified viral URI, COVID-19, influenza, RSV, pneumonia  MDM: Patient seen emergency room for evaluation of cough, chills, fever.  Physical exam is largely unremarkable.  Patient is influenza a positive which explains her constellation of symptoms.  Patient treated symptomatically with Toradol and will be discharged with Tessalon and Naprosyn.  She is not hypoxic and not in respiratory distress.  Outside the window for Tamiflu use.  She does not meet inpatient criteria for admission and will be discharged with outpatient follow-up   Additional history obtained:  -External records from outside source obtained and reviewed including: Chart review including previous notes, labs, imaging, consultation notes   Lab Tests: -I ordered, reviewed, and interpreted labs.   The pertinent results include:   Labs Reviewed  RESP PANEL BY RT-PCR (RSV, FLU A&B, COVID)  RVPGX2 - Abnormal; Notable for the  following components:      Result Value   Influenza A by PCR POSITIVE (*)    All other components within normal limits    Medicines ordered and prescription drug management: Meds ordered this encounter  Medications   ketorolac (TORADOL) 15 MG/ML injection 15 mg   naproxen (NAPROSYN) 375 MG tablet    Sig: Take 1 tablet (375 mg total) by mouth 2 (two) times daily.    Dispense:  20 tablet    Refill:  0   benzonatate (TESSALON) 100 MG capsule    Sig: Take 1 capsule (100 mg total) by mouth every 8 (eight) hours.    Dispense:  21 capsule    Refill:  0    -I have reviewed the patients home medicines and have made adjustments as needed  Critical interventions none   Social Determinants of Health:  Factors impacting patients care include: none   Reevaluation: After the interventions noted above, I reevaluated the patient and found that they have :improved  Co morbidities that complicate the patient evaluation  Past Medical History:  Diagnosis Date   HSV-2 (herpes simplex virus 2) infection    Leukemia ([facility])    Pregnancy as incidental finding       Dispostion: I considered admission for this patient, but at this time she does not meet inpatient criteria for admission and will be discharged with outpatient follow-up     Final Clinical Impression(s) / ED Diagnoses Final diagnoses:  Influenza A     @PCDICTATION @    Glendora Score, MD 05/24/23 [phone]

## 2023-10-31 ENCOUNTER — Encounter: Admitting: Obstetrics and Gynecology
